# Patient Record
Sex: Female | Born: 1990 | Race: White | Hispanic: No | Marital: Single | Smoking: Never smoker
Health system: Southern US, Community
[De-identification: ages and names within clinical notes are randomized; demographics above are authoritative.]

## PROBLEM LIST (undated history)

## (undated) DIAGNOSIS — Z7689 Persons encountering health services in other specified circumstances: Secondary | ICD-10-CM

## (undated) DIAGNOSIS — E282 Polycystic ovarian syndrome: Secondary | ICD-10-CM

## (undated) DIAGNOSIS — R51 Headache: Secondary | ICD-10-CM

## (undated) DIAGNOSIS — F909 Attention-deficit hyperactivity disorder, unspecified type: Secondary | ICD-10-CM

## (undated) DIAGNOSIS — E88819 Insulin resistance, unspecified: Secondary | ICD-10-CM

## (undated) DIAGNOSIS — I10 Essential (primary) hypertension: Secondary | ICD-10-CM

## (undated) DIAGNOSIS — E669 Obesity, unspecified: Secondary | ICD-10-CM

## (undated) DIAGNOSIS — E8881 Metabolic syndrome: Secondary | ICD-10-CM

## (undated) DIAGNOSIS — K219 Gastro-esophageal reflux disease without esophagitis: Secondary | ICD-10-CM

## (undated) HISTORY — DX: Metabolic syndrome: E88.81

## (undated) HISTORY — DX: Essential (primary) hypertension: I10

## (undated) HISTORY — DX: Headache: R51

## (undated) HISTORY — DX: Persons encountering health services in other specified circumstances: Z76.89

## (undated) HISTORY — DX: Obesity, unspecified: E66.9

## (undated) HISTORY — DX: Attention-deficit hyperactivity disorder, unspecified type: F90.9

## (undated) HISTORY — PX: OTHER SURGICAL HISTORY: SHX169

## (undated) HISTORY — DX: Insulin resistance, unspecified: E88.819

## (undated) HISTORY — DX: Polycystic ovarian syndrome: E28.2

## (undated) HISTORY — DX: Gastro-esophageal reflux disease without esophagitis: K21.9

---
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
## 2012-06-05 ENCOUNTER — Other Ambulatory Visit: Payer: Self-pay | Admitting: Gastroenterology

## 2012-06-07 ENCOUNTER — Other Ambulatory Visit: Payer: Self-pay | Admitting: Family Medicine

## 2012-06-07 DIAGNOSIS — K219 Gastro-esophageal reflux disease without esophagitis: Secondary | ICD-10-CM

## 2012-06-07 MED ORDER — OMEPRAZOLE 40 MG PO CPDR: 40.0000 mg | DELAYED_RELEASE_CAPSULE | Freq: Two times a day (BID) | ORAL | Status: DC

## 2012-06-07 NOTE — Telephone Encounter (Signed)
Needs refill Omeprazole 40 mg BID.  Dr Russella Dar will not refill w/o OV.  Need to renew Medicaid referral.  Temp refill of medication sent to pharmacy.  Will initiate new referral to GI.  Reminded mother patient also due for OV with [place].

## 2012-06-07 NOTE — Telephone Encounter (Signed)
Mother called.  Daughter sees Dr Russella Dar for GERD.  Needs refill

## 2012-06-08 ENCOUNTER — Encounter: Payer: Self-pay | Admitting: *Deleted

## 2012-06-18 ENCOUNTER — Encounter: Payer: Self-pay | Admitting: Gastroenterology

## 2012-06-18 ENCOUNTER — Ambulatory Visit (INDEPENDENT_AMBULATORY_CARE_PROVIDER_SITE_OTHER): Payer: Medicaid Other | Admitting: Gastroenterology

## 2012-06-18 VITALS — BP 110/80 | HR 68 | Ht 61.0 in | Wt 259.0 lb

## 2012-06-18 DIAGNOSIS — K219 Gastro-esophageal reflux disease without esophagitis: Secondary | ICD-10-CM

## 2012-06-18 MED ORDER — OMEPRAZOLE 40 MG PO CPDR: 40.0000 mg | DELAYED_RELEASE_CAPSULE | Freq: Two times a day (BID) | ORAL | Status: DC

## 2012-06-18 NOTE — Progress Notes (Signed)
History of Present Illness: This is a 22 year old female accompanied by her mother. She has chronic stable GERD. Symptoms have been stable for several years on a regimen of omeprazole twice daily. Occasional breakthrough symptoms with high fat foods.   Current Medications, Allergies, Past Medical History, Past Surgical History, Family History and Social History were reviewed in [employer] record.  Physical Exam: General: Well developed , well nourished, obese, no acute distress Head: Normocephalic and atraumatic Eyes:  sclerae anicteric, EOMI Ears: Normal auditory acuity Mouth: No deformity or lesions Lungs: Clear throughout to auscultation Heart: Regular rate and rhythm; no murmurs, rubs or bruits Abdomen: Soft, non tender and non distended. No masses, hepatosplenomegaly or hernias noted. Normal Bowel sounds Musculoskeletal: Symmetrical with no gross deformities  Pulses:  Normal pulses noted Extremities: No clubbing, cyanosis, edema or deformities noted Neurological: Alert oriented x 4, grossly nonfocal Psychological:  Alert and cooperative. Normal mood and affect  Assessment and Recommendations:  1. GERD. Stable for years. Well controlled on current regimen. Continue omeprazole 40 mg a day and standard antireflux measures. I recommended that she return to the care of her primary physician for ongoing followup of this problem and medication refills. I will see back as needed.

## 2012-06-18 NOTE — Patient Instructions (Addendum)
We have sent the following medications to your pharmacy for you to pick up at your convenience: omeprazole.   Please follow up with Allayne Butcher, NP for further routine medication refills and follow up with Dr. Russella Dar as needed.  Thank you for choosing me and [facility].  Venita Lick. Pleas Koch., MD., Clementeen Graham  cc: Allayne Butcher, NP

## 2014-04-21 ENCOUNTER — Other Ambulatory Visit: Payer: Self-pay | Admitting: Gastroenterology

## 2014-04-24 ENCOUNTER — Encounter: Payer: Self-pay | Admitting: Physician Assistant

## 2014-04-24 ENCOUNTER — Ambulatory Visit (INDEPENDENT_AMBULATORY_CARE_PROVIDER_SITE_OTHER): Payer: Medicaid Other | Admitting: Physician Assistant

## 2014-04-24 VITALS — BP 128/70 | HR 80 | Temp 97.5°F | Resp 18 | Wt 265.0 lb

## 2014-04-24 DIAGNOSIS — K219 Gastro-esophageal reflux disease without esophagitis: Secondary | ICD-10-CM

## 2014-04-24 MED ORDER — OMEPRAZOLE 40 MG PO CPDR: 40.0000 mg | DELAYED_RELEASE_CAPSULE | Freq: Two times a day (BID) | ORAL | Status: DC

## 2014-04-24 NOTE — Progress Notes (Signed)
    Patient ID: Heidi Clark MRN: [id number], DOB: 08/17/1990, 24 y.o. Date of Encounter: 04/24/2014, 11:31 AM    Chief Complaint:  Chief Complaint  Patient presents with  . check up for med refills    omeprazole for GERD     HPI: 24 y.o. year old female here with her mom. A say that they are here for follow-up so they can get refills on her omeprazole. She has been evaluated by Dr. Chestine Sporelark at pediatric GI in the past. They say that they then also went to [facility] for follow-up after that. However [facility] had told them that things were stable and they could come here to get her medication prescribed. She has to take the omeprazole twice a day every day or all she has reflux symptoms. However with taking the medication twice daily the symptoms are completely controlled. He has no complaints or concerns today.     Home Meds:   Outpatient Prescriptions Prior to Visit  Medication Sig Dispense Refill  . Norethin-Eth Estradiol-Fe [facility](FEMCON FE,WYMZYA Cecille AmsterdamFE,ZENCHENT FE,ZEOSA) 0.4-35 MG-MCG tablet Chew 1 tablet by mouth daily. 1 Package 11  . omeprazole (PRILOSEC) 40 MG capsule Take 1 capsule (40 mg total) by mouth 2 (two) times daily. 60 capsule 11  . methylphenidate (DAYTRANA) 30 MG/9HR Place 1 patch onto the skin daily. wear patch for 9 hours only each day      No facility-administered medications prior to visit.    Allergies: No Known Allergies    Review of Systems: See HPI for pertinent ROS. All other ROS negative.    Physical Exam: Blood pressure 128/70, pulse 80, temperature 97.5 F (36.4 C), temperature source Oral, resp. rate 18, weight 265 lb (120.203 kg)., Body mass index is 50.1 kg/(m^2). General:  Severely obese white female . Appears in no acute distress. Lungs: Clear bilaterally to auscultation without wheezes, rales, or rhonchi. Breathing is unlabored. Heart: Regular rhythm. No murmurs, rubs, or gallops. Abdomen: Soft, non-tender, non-distended with normoactive bowel  sounds. No hepatomegaly. No rebound/guarding. No obvious abdominal masses. There is no tenderness with palpation not even in the epigastric region. Msk:  Strength and tone normal for age. Extremities/Skin: Warm and dry. Neuro: Alert and oriented X 3. Moves all extremities spontaneously. Gait is normal. CNII-XII grossly in tact. Psych:  Responds to questions appropriately with a normal affect.     ASSESSMENT AND PLAN:  24 y.o. year old female with  1. Gastroesophageal reflux disease, esophagitis presence not specified Stable and controlled on current medication. - omeprazole (PRILOSEC) 40 MG capsule; Take 1 capsule (40 mg total) by mouth 2 (two) times daily.  Dispense: 60 capsule; Refill: [address]11   Signed, Mary Beth Jun[place] Terr[place], [place], [facility] 04/24/2014 11:31 AM

## 2014-08-15 ENCOUNTER — Telehealth: Payer: Self-pay | Admitting: Physician Assistant

## 2014-08-15 NOTE — Telephone Encounter (Signed)
error 

## 2014-08-21 ENCOUNTER — Ambulatory Visit (INDEPENDENT_AMBULATORY_CARE_PROVIDER_SITE_OTHER): Payer: Medicaid Other | Admitting: Physician Assistant

## 2014-08-21 ENCOUNTER — Encounter: Payer: Self-pay | Admitting: Physician Assistant

## 2014-08-21 VITALS — BP 128/80 | HR 82 | Temp 98.3°F | Resp 19 | Wt 260.0 lb

## 2014-08-21 DIAGNOSIS — E8881 Metabolic syndrome: Secondary | ICD-10-CM | POA: Diagnosis not present

## 2014-08-21 DIAGNOSIS — E669 Obesity, unspecified: Secondary | ICD-10-CM | POA: Diagnosis not present

## 2014-08-21 DIAGNOSIS — Z124 Encounter for screening for malignant neoplasm of cervix: Secondary | ICD-10-CM

## 2014-08-21 DIAGNOSIS — E282 Polycystic ovarian syndrome: Secondary | ICD-10-CM | POA: Diagnosis not present

## 2014-08-21 DIAGNOSIS — Z01419 Encounter for gynecological examination (general) (routine) without abnormal findings: Secondary | ICD-10-CM

## 2014-08-21 DIAGNOSIS — Z1239 Encounter for other screening for malignant neoplasm of breast: Secondary | ICD-10-CM

## 2014-08-21 MED ORDER — JUNEL FE 1.5/30 1.5-30 MG-MCG PO TABS: 1.0000 | ORAL_TABLET | Freq: Every day | ORAL | Status: DC

## 2014-08-21 NOTE — Progress Notes (Signed)
Patient ID: Heidi Clark MRN: [id number], DOB: 26-Jul-1990, 24 y.o. Date of Encounter: @DATE @  Chief Complaint:  Chief Complaint  Patient presents with  . Pap smear    HPI: 24 y.o. year old white female  presents with her mom for visit today.  Mom reports that Heidi Clark has been seeing [address] [facility] for years. However, says that when she called to schedule an appointment recently, they told her that they were no longer accepting her insurance. Mom says that Heidi Clark is due for her annual exam and has to have refill on her pills or else her menses will be irregular.  In obtaining history from mom, she states that Heidi Clark's Pediatrician had referred her to the OB/GYN because Heidi Clark's periods were irregular. She says that once they started her on these  Pills (Junel FE),  she has been having regular menses.  However upon chart review also see diagnoses of obesity insulin resistance and polycystic ovarian syndrome.  They state that her last menstrual period was 08/06/2014.   report that as long as she takes these pills that she has monthly menstrual bleeding and regular cycles. They have no complaints or concerns today otherwise.   Past Medical History  Diagnosis Date  . GERD (gastroesophageal reflux disease)   . ADHD (attention deficit hyperactivity disorder)   . Insulin resistance   . Legg-Calve-Perthes disease   . ADHD (attention deficit hyperactivity disorder)   . Polycystic ovarian syndrome   . Obesity   . Headache(784.0)      Home Meds: Outpatient Prescriptions Prior to Visit  Medication Sig Dispense Refill  . omeprazole (PRILOSEC) 40 MG capsule Take 1 capsule (40 mg total) by mouth 2 (two) times daily. 60 capsule 11  . JUNEL FE 1.5/30 1.5-30 MG-MCG tablet Take 1 tablet by mouth daily.  11  . methylphenidate (DAYTRANA) 30 MG/9HR Place 1 patch onto the skin daily. wear patch for 9 hours only each day      No facility-administered medications prior to visit.     Allergies: No Known Allergies  History   Social History  . Marital Status: Married    Spouse Name: N/A  . Number of Children: N/A  . Years of Education: N/A   Occupational History  . Not on file.   Social History Main Topics  . Smoking status: Never Smoker   . Smokeless tobacco: Never Used  . Alcohol Use: No  . Drug Use: No  . Sexual Activity: No     Comment: CELIBATE   Other Topics Concern  . Not on file   Social History Narrative    Family History  Problem Relation Age of Onset  . Diabetes Mother   . Obesity Mother   . Colon cancer Neg Hx      Review of Systems:  See HPI for pertinent ROS. All other ROS negative.    Physical Exam: Blood pressure 128/80, pulse 82, temperature 98.3 F (36.8 C), temperature source Oral, resp. rate 19, weight 260 lb (117.935 kg), last menstrual period 08/13/2014., Body mass index is 49.15 kg/(m^2). General: Severely obese white female Appears in no acute distress. Neck: Supple. No thyromegaly. No lymphadenopathy. Lungs: Clear bilaterally to auscultation without wheezes, rales, or rhonchi. Breathing is unlabored. Heart: RRR with S1 S2. No murmurs, rubs, or gallops. Breast Exam: She has very small breast. Skin is normal with no skin changes. There are no masses and no nipple discharge. Pelvic Exam: External genitalia normal. Vaginal mucosa appears normal. She starts  clinching her muscles tightly and crying out and holding to her mother's hand even when I just touched her thighs much less when I tried to insert the speculum. Could not get a good visualization of the cervix. Bimanual exam is normal but is limited because of her obesity. Extremities/Skin: Warm and dry.  Neuro: Alert and oriented X 3. Moves all extremities spontaneously. Gait is normal. CNII-XII grossly in tact. Psych:  Responds to questions appropriately with a normal affect.  Clinches her muscles and cries out and holds her mom's hand during pelvic exam       ASSESSMENT AND PLAN:  24 y.o. year old female with  1. Encounter for cervical Pap smear with pelvic exam - PAP, ThinPrep ASCUS Rflx HPV Rflx Type  2. Encounter for screening breast examination  3. Obesity - Ambulatory referral to Gynecology  4. Insulin resistance - Ambulatory referral to Gynecology  5. Polycystic ovarian syndrome - Ambulatory referral to Gynecology  Refill sent in on her current pills which is the Junel FE.  Pap sent.  Will schedule follow-up with gynecology.  Signed, [address] [place], [place], [facility] 08/21/2014 11:56 AM

## 2014-08-22 LAB — PAP THINPREP ASCUS RFLX HPV RFLX TYPE

## 2015-01-05 ENCOUNTER — Encounter: Payer: Self-pay | Admitting: Physician Assistant

## 2015-01-05 ENCOUNTER — Ambulatory Visit (INDEPENDENT_AMBULATORY_CARE_PROVIDER_SITE_OTHER): Payer: Medicaid Other | Admitting: Physician Assistant

## 2015-01-05 VITALS — BP 124/70 | HR 112 | Temp 98.7°F | Resp 18 | Wt 264.0 lb

## 2015-01-05 DIAGNOSIS — J988 Other specified respiratory disorders: Secondary | ICD-10-CM

## 2015-01-05 DIAGNOSIS — B9689 Other specified bacterial agents as the cause of diseases classified elsewhere: Secondary | ICD-10-CM

## 2015-01-05 MED ORDER — HYDROCODONE-HOMATROPINE 5-1.5 MG/5ML PO SYRP: 5.0000 mL | ORAL_SOLUTION | Freq: Three times a day (TID) | ORAL | Status: DC | PRN

## 2015-01-05 MED ORDER — AZITHROMYCIN 250 MG PO TABS: ORAL_TABLET | ORAL | Status: DC

## 2015-01-05 NOTE — Progress Notes (Signed)
    Patient ID: Heidi BarrierHannah D Clark MRN: [id number], DOB: 04/22/1990, 24 y.o. Date of Encounter: 01/05/2015, 2:55 PM    Chief Complaint:  Chief Complaint  Patient presents with  . croopy cough x 4 days    worse at night     HPI: 24 y.o. year old white female here with her mom. They report that she has been having the above symptoms. Cough sounds very congested. Is getting very little sleep secondary to cough at night. No nasal congestion or mucus from the nose, no sore throat, no ear ache, no fevers or chills.      Home Meds:   Outpatient Prescriptions Prior to Visit  Medication Sig Dispense Refill  . JUNEL FE 1.5/30 1.5-30 MG-MCG tablet Take 1 tablet by mouth daily. 1 Package 11  . omeprazole (PRILOSEC) 40 MG capsule Take 1 capsule (40 mg total) by mouth 2 (two) times daily. 60 capsule 11   No facility-administered medications prior to visit.    Allergies: No Known Allergies    Review of Systems: See HPI for pertinent ROS. All other ROS negative.    Physical Exam: Blood pressure 124/70, pulse 112, temperature 98.7 F (37.1 C), temperature source Oral, resp. rate 18, weight 264 lb (119.75 kg)., Body mass index is 49.91 kg/(m^2). General:  Severely obese white female . Appears in no acute distress. HEENT: Normocephalic, atraumatic, eyes without discharge, sclera non-icteric, nares are without discharge. Bilateral auditory canals clear, TM's are without perforation, pearly grey and translucent with reflective cone of light bilaterally. Oral cavity moist, posterior pharynx without exudate, erythema, peritonsillar abscess.  Neck: Supple. No thyromegaly. No lymphadenopathy. Lungs: Clear bilaterally to auscultation without wheezes, rales, or rhonchi. Breathing is unlabored. Heart: Regular rhythm. No murmurs, rubs, or gallops. Msk:  Strength and tone normal for age. Extremities/Skin: Warm and dry. Neuro: Alert and oriented X 3. Moves all extremities spontaneously. Gait is normal.  CNII-XII grossly in tact. Psych:  Responds to questions appropriately with a normal affect.     ASSESSMENT AND PLAN:  24 y.o. year old female with  1. Bacterial respiratory infection Mom requests additional medication in addition to antibiotic that can be used so Heidi Clark  can get some sleep. She is to take the antibiotic as directed. Can use the cough medication at bedtime to suppress cough at that point.  Followup if symptoms worsen significantly or if they do not resolve within 1 week after completion of antibiotic. - azithromycin (ZITHROMAX) 250 MG tablet; Day 1: Take 2 daily.  Days 2-5: Take 1 daily.  Dispense: 6 tablet; Refill: 0 - HYDROcodone-homatropine (HYCODAN) 5-1.5 MG/5ML syrup; Take 5 mLs by mouth every 8 (eight) hours as needed for cough.  Dispense: 120 mL; Refill: 0   Signed, [address] StreetMary Beth Juniata GapDixon, [place], [facility] 01/05/2015 2:55 PM

## 2015-01-06 ENCOUNTER — Telehealth: Payer: Self-pay | Admitting: Physician Assistant

## 2015-01-06 NOTE — Telephone Encounter (Signed)
Patients mom calling to say that the cough syrup that was prescribed yesterday is not covered by medicaid, would like to know if something else can be called in  [facility] [address]  [phone]

## 2015-01-06 NOTE — Telephone Encounter (Signed)
Mother called.  There are no covered cough medications

## 2015-09-10 ENCOUNTER — Other Ambulatory Visit: Payer: Self-pay | Admitting: Physician Assistant

## 2015-09-10 NOTE — Telephone Encounter (Signed)
Refill appropriate and filled per protocol. 

## 2015-09-17 ENCOUNTER — Ambulatory Visit (INDEPENDENT_AMBULATORY_CARE_PROVIDER_SITE_OTHER): Payer: Medicaid Other | Admitting: Physician Assistant

## 2015-09-17 VITALS — BP 100/84 | Temp 97.9°F | Ht 60.5 in | Wt 286.0 lb

## 2015-09-17 DIAGNOSIS — N939 Abnormal uterine and vaginal bleeding, unspecified: Secondary | ICD-10-CM | POA: Diagnosis not present

## 2015-09-17 DIAGNOSIS — E8881 Metabolic syndrome: Secondary | ICD-10-CM | POA: Diagnosis not present

## 2015-09-17 DIAGNOSIS — E669 Obesity, unspecified: Secondary | ICD-10-CM

## 2015-09-17 DIAGNOSIS — E282 Polycystic ovarian syndrome: Secondary | ICD-10-CM | POA: Diagnosis not present

## 2015-09-17 MED ORDER — NORETHIN ACE-ETH ESTRAD-FE 1.5-30 MG-MCG PO TABS: 1.0000 | ORAL_TABLET | Freq: Every day | ORAL | Status: DC

## 2015-09-17 NOTE — Progress Notes (Signed)
Patient ID: Heidi Clark MRN: [id number], DOB: 08/03/1990, 25 y.o. Date of Encounter: @DATE @  Chief Complaint:  Chief Complaint  Patient presents with  . Annual Exam    HPI: 25 y.o. year old white female  presents with her mom for visit today.  08/21/2014: Mom reports that Heidi Clark has [facility] for [facility]. However, says that when she called to schedule an appointment recently, they told her that they were no longer accepting her insurance. Mom says that Heidi Clark is due for her annual exam and has to have refill on her pills or else her menses will be irregular.  In obtaining history from mom, she states that Heidi Clark's Pediatrician had referred her to the OB/GYN because Heidi Clark's periods were irregular. She says that once they started her on these  Pills (Junel FE),  she has been having regular menses.  However upon chart review also see diagnoses of obesity insulin resistance and polycystic ovarian syndrome.  They state that her last menstrual period was 08/06/2014.   report that as long as she takes these pills that she has monthly menstrual bleeding and regular cycles. They have no complaints or concerns today otherwise.   09/17/2015: Mom is here with her for visit again today. I reviewed with them that at her last visit 08/21/14 I placed order for referral to a new gynecologist. Mom states that they never heard anything about that appointment/that referral. Today I have ordered that referral again and have told her that if she does not hear anything about that referral/appointment within the next 2-3 weeks then to call here and asked to speak to [facility].  Heidi Clark has continued take the birth control pill daily. She has continued to have regular menses.  No complaints or concerns today.   Past Medical History  Diagnosis Date  . GERD (gastroesophageal reflux disease)   . ADHD (attention deficit hyperactivity disorder)   . Insulin resistance   .  Legg-Calve-Perthes disease   . ADHD (attention deficit hyperactivity disorder)   . Polycystic ovarian syndrome   . Obesity   . Headache(784.0)      Home Meds: Outpatient Prescriptions Prior to Visit  Medication Sig Dispense Refill  . omeprazole (PRILOSEC) 40 MG capsule Take 1 capsule (40 mg total) by mouth 2 (two) times daily. 60 capsule 11  . JUNEL FE 1.5/30 1.5-30 MG-MCG tablet TAKE 1 TABLET BY MOUTH DAILY. 28 tablet 10  . azithromycin (ZITHROMAX) 250 MG tablet Day 1: Take 2 daily.  Days 2-5: Take 1 daily. 6 tablet 0  . HYDROcodone-homatropine (HYCODAN) 5-1.5 MG/5ML syrup Take 5 mLs by mouth every 8 (eight) hours as needed for cough. 120 mL 0   No facility-administered medications prior to visit.    Allergies: No Known Allergies  Social History   Social History  . Marital Status: Married    Spouse Name: N/A  . Number of Children: N/A  . Years of Education: N/A   Occupational History  . Not on file.   Social History Main Topics  . Smoking status: Never Smoker   . Smokeless tobacco: Never Used  . Alcohol Use: No  . Drug Use: No  . Sexual Activity: No     Comment: CELIBATE   Other Topics Concern  . Not on file   Social History Narrative    Family History  Problem Relation Age of Onset  . Diabetes Mother   . Obesity Mother   . Colon cancer Neg Hx  Review of Systems:  See HPI for pertinent ROS. All other ROS negative.    Physical Exam: Blood pressure 100/84, temperature 97.9 F (36.6 C), temperature source Oral, height 5' 0.5" (1.537 m), weight 286 lb (129.729 kg)., Body mass index is 54.91 kg/(m^2). General: Severely obese white female Appears in no acute distress. Neck: Supple. No thyromegaly. No lymphadenopathy. Lungs: Clear bilaterally to auscultation without wheezes, rales, or rhonchi. Breathing is unlabored. Heart: RRR with S1 S2. No murmurs, rubs, or gallops. Breast Exam: She has very small breast. Skin is normal with no skin changes. There are no  masses and no nipple discharge. Pelvic Exam: She has very sparse amount of pubic hair. External genitalia normal. Vaginal mucosa appears normal. She starts clinching her muscles tightly and crying out even when I just touched her thighs much less when I tried to insert the speculum. Could not get a good visualization of the cervix. Bimanual exam is normal but is limited because of her obesity. Extremities/Skin: Warm and dry.  Neuro: Alert and oriented X 3. Moves all extremities spontaneously. Gait is normal. CNII-XII grossly in tact. Psych:  Responds to questions appropriately with a normal affect.        ASSESSMENT AND PLAN:  25 y.o. year old female with   1. Abnormal uterine bleeding - norethindrone-ethinyl estradiol-iron (JUNEL FE 1.5/30) 1.5-30 MG-MCG tablet; Take 1 tablet by mouth daily.  Dispense: 28 tablet; Refill: 10 - Ambulatory referral to Gynecology  2. Obesity - Ambulatory referral to Gynecology  3. Insulin resistance - Ambulatory referral to Gynecology  4. Polycystic ovarian syndrome - Ambulatory referral to Gynecology  Pap smear was performed at her visit here 08/21/14. This was normal. Cytotology negative.  Refill sent in on her current pills which is the Junel FE.   Will schedule follow-up with gynecology. Told her mother that if she has not heard anything about this referral within the next 2-3 weeks and to call here and asked to speak with Selena BattenKim. Explained to them that she needs follow-up with the specialist regarding her PCOS and insulin resistance. They voice understanding and agree.  Signed, [address]Mary Beth Silver SpringsDixon, [place], [facility] 09/17/2015 10:53 AM

## 2015-09-23 ENCOUNTER — Encounter: Payer: Self-pay | Admitting: *Deleted

## 2015-09-25 ENCOUNTER — Encounter: Payer: Self-pay | Admitting: Adult Health

## 2015-09-25 ENCOUNTER — Ambulatory Visit (INDEPENDENT_AMBULATORY_CARE_PROVIDER_SITE_OTHER): Payer: Medicaid Other | Admitting: Adult Health

## 2015-09-25 VITALS — BP 132/80 | HR 106 | Ht 61.0 in | Wt 286.0 lb

## 2015-09-25 DIAGNOSIS — Z0000 Encounter for general adult medical examination without abnormal findings: Secondary | ICD-10-CM | POA: Diagnosis not present

## 2015-09-25 DIAGNOSIS — E282 Polycystic ovarian syndrome: Secondary | ICD-10-CM

## 2015-09-25 DIAGNOSIS — Z7689 Persons encountering health services in other specified circumstances: Secondary | ICD-10-CM

## 2015-09-25 DIAGNOSIS — Z01419 Encounter for gynecological examination (general) (routine) without abnormal findings: Secondary | ICD-10-CM | POA: Insufficient documentation

## 2015-09-25 DIAGNOSIS — E669 Obesity, unspecified: Secondary | ICD-10-CM

## 2015-09-25 HISTORY — DX: Persons encountering health services in other specified circumstances: Z76.89

## 2015-09-25 MED ORDER — NORETHIN-ETH ESTRAD-FE BIPHAS 1 MG-10 MCG / 10 MCG PO TABS: 1.0000 | ORAL_TABLET | Freq: Every day | ORAL | Status: DC

## 2015-09-25 NOTE — Patient Instructions (Signed)
Start  Lo loestrin when finished with current pack Follow up in 3 months

## 2015-09-25 NOTE — Progress Notes (Signed)
Patient ID: Heidi Clark, female   DOB: 11/25/1990, 25 y.o.   MRN: [id number] History of Present Illness:  Heidi ClientHannah is a 25 year old white female,single, in with her Mom for well woman gyn exam, she had normal pap in 2016.She is on OCs but says periods last about 4 days and heavy at times and changes pads every 3-4 hours, she says she has PCO. PCP is Frazier RichardsMary Beth Dixon, [place].   Current Medications, Allergies, Past Medical History, Past Surgical History, Family History and Social History were reviewed in [facility] electronic [facility] record.     Review of Systems: Patient denies any headaches, hearing loss, fatigue, blurred vision, shortness of breath, chest pain, abdominal pain, problems with bowel movements, urination, or intercourse(not having sex). No joint pain or mood swings.See HPI for positives.    Physical Exam:BP 132/80 mmHg  Pulse 106  Ht 5\' 1"  (1.549 m)  Wt 286 lb (129.729 kg)  BMI 54.07 kg/m2  LMP 09/07/2015 General:  Well developed, well nourished, no acute distress Skin:  Warm and dry,no increase in hair growth Neck:  Midline trachea, normal thyroid, good ROM, no lymphadenopathy Lungs; Clear to auscultation bilaterally Breast:  No dominant palpable mass, retraction, or nipple discharge Cardiovascular: Regular rate and rhythm Abdomen:  Soft, non tender, no hepatosplenomegaly,obese Pelvic:  External genitalia is normal in appearance, no lesions.  The vagina is normal in appearance. Urethra has no lesions or masses. The cervix is tiny, and poorly seem.  Uterus is felt to be normal size, shape, and contour.  No adnexal masses or tenderness noted.Bladder is non tender, no masses felt.But difficult due to size and she holds legs stiff Extremities/musculoskeletal:  No swelling or varicosities noted, no clubbing or cyanosis Psych:  No mood changes, alert and cooperative,seems happy   Impression: Well woman gyn exam no pap Period management Obesity History of  PCO    Plan: Check CBC,CMP,TSH and lipids,A1c and vitamin D Finish current pack OCs then start lo loestrin Rx lo loestrin disp 1 pack take 1 daily with 11 refills Follow up in 3 months

## 2015-09-26 LAB — COMPREHENSIVE METABOLIC PANEL
A/G RATIO: 1.2 (ref 1.2–2.2)
ALT: 14 IU/L (ref 0–32)
AST: 16 IU/L (ref 0–40)
Albumin: 4.1 g/dL (ref 3.5–5.5)
Alkaline Phosphatase: 92 IU/L (ref 39–117)
BUN/Creatinine Ratio: 16 (ref 9–23)
BUN: 9 mg/dL (ref 6–20)
Bilirubin Total: 0.3 mg/dL (ref 0.0–1.2)
CALCIUM: 9.3 mg/dL (ref 8.7–10.2)
CO2: 20 mmol/L (ref 18–29)
Chloride: 99 mmol/L (ref 96–106)
Creatinine, Ser: 0.55 mg/dL — ABNORMAL LOW (ref 0.57–1.00)
GFR, EST AFRICAN AMERICAN: 153 mL/min/{1.73_m2} (ref >59)
GFR, EST NON AFRICAN AMERICAN: 133 mL/min/{1.73_m2} (ref >59)
GLOBULIN, TOTAL: 3.3 g/dL (ref 1.5–4.5)
Glucose: 98 mg/dL (ref 65–99)
POTASSIUM: 4.1 mmol/L (ref 3.5–5.2)
SODIUM: 138 mmol/L (ref 134–144)
TOTAL PROTEIN: 7.4 g/dL (ref 6.0–8.5)

## 2015-09-26 LAB — CBC
HEMATOCRIT: 43.3 % (ref 34.0–46.6)
Hemoglobin: 14.4 g/dL (ref 11.1–15.9)
MCH: 29 pg (ref 26.6–33.0)
MCHC: 33.3 g/dL (ref 31.5–35.7)
MCV: 87 fL (ref 79–97)
Platelets: 323 10*3/uL (ref 150–379)
RBC: 4.97 x10E6/uL (ref 3.77–5.28)
RDW: 13.7 % (ref 12.3–15.4)
WBC: 12.6 10*3/uL — AB (ref 3.4–10.8)

## 2015-09-26 LAB — LIPID PANEL
CHOL/HDL RATIO: 3.9 ratio (ref 0.0–4.4)
Cholesterol, Total: 156 mg/dL (ref 100–199)
HDL: 40 mg/dL (ref >39)
LDL CALC: 76 mg/dL (ref 0–99)
Triglycerides: 198 mg/dL — ABNORMAL HIGH (ref 0–149)
VLDL Cholesterol Cal: 40 mg/dL (ref 5–40)

## 2015-09-26 LAB — VITAMIN D 25 HYDROXY (VIT D DEFICIENCY, FRACTURES): Vit D, 25-Hydroxy: 23.4 ng/mL — ABNORMAL LOW (ref 30.0–100.0)

## 2015-09-26 LAB — HEMOGLOBIN A1C
Est. average glucose Bld gHb Est-mCnc: 111 mg/dL
Hgb A1c MFr Bld: 5.5 % (ref 4.8–5.6)

## 2015-09-26 LAB — TSH: TSH: 3.14 u[IU]/mL (ref 0.450–4.500)

## 2015-09-28 ENCOUNTER — Telehealth: Payer: Self-pay | Admitting: Adult Health

## 2015-09-28 NOTE — Telephone Encounter (Signed)
No answer

## 2015-09-29 ENCOUNTER — Telehealth: Payer: Self-pay | Admitting: Adult Health

## 2015-09-29 NOTE — Telephone Encounter (Signed)
Pt got your call last pm. I spoke with pt letting her know you were out of the office this week. May be Monday before she hears back from office or could be before. Pt is ok with that. Thanks!! JSY

## 2015-10-03 MED ORDER — CHOLECALCIFEROL 125 MCG (5000 UT) PO CAPS: 5000.0000 [IU] | ORAL_CAPSULE | Freq: Every day | ORAL | Status: AC

## 2015-10-03 NOTE — Telephone Encounter (Signed)
Aware of labs and need to take vitamin D3 5000 IU every day at least 2000 IU, if can't do 5000 IU and decrease carbs and fats to aid in weight loss

## 2015-12-23 ENCOUNTER — Ambulatory Visit (INDEPENDENT_AMBULATORY_CARE_PROVIDER_SITE_OTHER): Payer: Medicaid Other | Admitting: Adult Health

## 2015-12-23 ENCOUNTER — Encounter: Payer: Self-pay | Admitting: Adult Health

## 2015-12-23 VITALS — BP 100/80 | HR 82 | Ht 61.0 in | Wt 289.0 lb

## 2015-12-23 DIAGNOSIS — E282 Polycystic ovarian syndrome: Secondary | ICD-10-CM

## 2015-12-23 DIAGNOSIS — Z6841 Body Mass Index (BMI) 40.0 and over, adult: Secondary | ICD-10-CM | POA: Diagnosis not present

## 2015-12-23 DIAGNOSIS — Z308 Encounter for other contraceptive management: Secondary | ICD-10-CM | POA: Diagnosis not present

## 2015-12-23 DIAGNOSIS — E6609 Other obesity due to excess calories: Secondary | ICD-10-CM

## 2015-12-23 DIAGNOSIS — IMO0001 Reserved for inherently not codable concepts without codable children: Secondary | ICD-10-CM

## 2015-12-23 DIAGNOSIS — Z7689 Persons encountering health services in other specified circumstances: Secondary | ICD-10-CM

## 2015-12-23 NOTE — Progress Notes (Signed)
Subjective:     Patient ID: Docia BarrierHannah D Fosdick, female   DOB: 03/24/1990, 25 y.o.   MRN: [id number]  HPI [name] is a 25 year old white female in for follow up of starting lo loestrin and she says she is doing good, no complaints so far.  Review of Systems Patient denies any headaches, hearing loss, fatigue, blurred vision, shortness of breath, chest pain, abdominal pain, problems with bowel movements, urination, or intercourse(not having sex). No joint pain or mood swings.   Reviewed past medical,surgical, social and family history. Reviewed medications and allergies.  Objective:   Physical Exam BP 100/80 (BP Location: Left Arm, Patient Position: Sitting, Cuff Size: Large)   Pulse 82   Ht 5\' 1"  (1.549 m)   Wt 289 lb (131.1 kg)   LMP 12/16/2015 (Exact Date)   BMI 54.61 kg/m  Skin warm and dry.  Lungs: clear to ausculation bilaterally. Cardiovascular: regular rate and rhythm.   PHQ 2 score 0. Encouraged to work on weight.  Assessment:     1. Encounter for menstrual regulation   2. Polycystic ovarian syndrome   3. Class 3 obesity due to excess calories without serious comorbidity with body mass index (BMI) of 50.0 to 59.9 in adult [facility](HCC)       Plan:     Continue lo loestrin Decrease carbs and increase exercise for weight loss  Follow up  in July for physical

## 2015-12-23 NOTE — Patient Instructions (Addendum)
Continue lo loestrin Decrease carbs and increase exercise for weight loss  Follow up  in July for physical

## 2016-08-08 ENCOUNTER — Other Ambulatory Visit: Payer: Self-pay | Admitting: Adult Health

## 2016-08-09 NOTE — Progress Notes (Signed)
Dr. Randa EvensJensen's office called for orders.

## 2016-08-09 NOTE — Pre-Procedure Instructions (Signed)
Heidi Clark  08/09/2016      [id number] [place], [place] - [address] [place] [address] Phone: [phone] Fax: [phone]    Your procedure is scheduled on 08-12-2016  Friday .  Report to [facility] Admitting at 7:00 A.M.   Call this number if you have problems the morning of surgery:  [phone]   Remember:  Do not eat food or drink liquids after midnight.   Take these medicines the morning of surgery with A SIP OF WATER omeprazole(Prilosec)   Do not wear jewelry, make-up or nail polish.  Do not wear lotions, powders, or perfumes, or deoderant.  Do not shave 48 hours prior to surgery.  Men may shave face and neck.   Do not bring valuables to the hospital.  [facility] is not responsible for any belongings or valuables.  Contacts, dentures or bridgework may not be worn into surgery.  Leave your suitcase in the car.  After surgery it may be brought to your room.  For patients admitted to the hospital, discharge time will be determined by your treatment team.  Patients discharged the day of surgery will not be allowed to drive home.   Special Instructions: [place] - Preparing for Surgery  Before surgery, you can play an important role.  Because skin is not sterile, your skin needs to be as free of germs as possible.  You can reduce the number of germs on you skin by washing with CHG (chlorahexidine gluconate) soap before surgery.  CHG is an antiseptic cleaner which kills germs and bonds with the skin to continue killing germs even after washing.  Please DO NOT use if you have an allergy to CHG or antibacterial soaps.  If your skin becomes reddened/irritated stop using the CHG and inform your nurse when you arrive at Short Stay.  Do not shave (including legs and underarms) for at least 48 hours prior to the first CHG shower.  You may shave your face.  Please follow these  instructions carefully:   1.  Shower with CHG Soap the night before surgery and the   morning of Surgery.  2.  If you choose to wash your hair, wash your hair first as usual with your normal shampoo.  3.  After you shampoo, rinse your hair and body thoroughly to remove the  Shampoo.  4.  Use CHG as you would any other liquid soap.  You can apply chg directly  to the skin and wash gently with scrungie or a clean washcloth.  5.  Apply the CHG Soap to your body ONLY FROM THE NECK DOWN.   Do not use on open wounds or open sores.  Avoid contact with your eyes,  ears, mouth and genitals (private parts).  Wash genitals (private parts) with your normal soap.  6.  Wash thoroughly, paying special attention to the area where your surgery will be performed.  7.  Thoroughly rinse your body with warm water from the neck down.  8.  DO NOT shower/wash with your normal soap after using and rinsing o  the CHG Soap.  9.  Pat yourself dry with a clean towel.            10.  Wear clean pajamas.            11.  Place clean sheets on your bed the night of your first shower and do  not sleep with pets.  Day of Surgery  Do not apply any lotions/deodorants the morning of surgery.  Please wear clean clothes to the hospital/surgery center.

## 2016-08-10 ENCOUNTER — Encounter (HOSPITAL_COMMUNITY)
Admission: RE | Admit: 2016-08-10 | Discharge: 2016-08-10 | Disposition: A | Discharge status: Home / Self Care | Payer: Medicaid Other | Source: Ambulatory Visit | Attending: Oral Surgery | Admitting: Oral Surgery

## 2016-08-10 ENCOUNTER — Encounter (HOSPITAL_COMMUNITY): Payer: Self-pay

## 2016-08-10 DIAGNOSIS — F909 Attention-deficit hyperactivity disorder, unspecified type: Secondary | ICD-10-CM | POA: Insufficient documentation

## 2016-08-10 DIAGNOSIS — E282 Polycystic ovarian syndrome: Secondary | ICD-10-CM | POA: Diagnosis not present

## 2016-08-10 DIAGNOSIS — Z7689 Persons encountering health services in other specified circumstances: Secondary | ICD-10-CM | POA: Insufficient documentation

## 2016-08-10 DIAGNOSIS — E669 Obesity, unspecified: Secondary | ICD-10-CM | POA: Insufficient documentation

## 2016-08-10 DIAGNOSIS — M9110 Juvenile osteochondrosis of head of femur [Legg-Calve-Perthes], unspecified leg: Secondary | ICD-10-CM | POA: Diagnosis not present

## 2016-08-10 DIAGNOSIS — Z01812 Encounter for preprocedural laboratory examination: Secondary | ICD-10-CM | POA: Insufficient documentation

## 2016-08-10 DIAGNOSIS — K219 Gastro-esophageal reflux disease without esophagitis: Secondary | ICD-10-CM | POA: Insufficient documentation

## 2016-08-10 LAB — CBC
HEMATOCRIT: 49 % — AB (ref 36.0–46.0)
HEMOGLOBIN: 16.5 g/dL — AB (ref 12.0–15.0)
MCH: 28.8 pg (ref 26.0–34.0)
MCHC: 33.7 g/dL (ref 30.0–36.0)
MCV: 85.5 fL (ref 78.0–100.0)
Platelets: 280 10*3/uL (ref 150–400)
RBC: 5.73 MIL/uL — ABNORMAL HIGH (ref 3.87–5.11)
RDW: 13.8 % (ref 11.5–15.5)
WBC: 11.9 10*3/uL — ABNORMAL HIGH (ref 4.0–10.5)

## 2016-08-10 LAB — BASIC METABOLIC PANEL
ANION GAP: 14 (ref 5–15)
BUN: 10 mg/dL (ref 6–20)
CO2: 19 mmol/L — AB (ref 22–32)
Calcium: 9.7 mg/dL (ref 8.9–10.3)
Chloride: 107 mmol/L (ref 101–111)
Creatinine, Ser: 0.63 mg/dL (ref 0.44–1.00)
GFR calc Af Amer: >60 mL/min (ref >60)
GFR calc non Af Amer: >60 mL/min (ref >60)
Glucose, Bld: 87 mg/dL (ref 65–99)
POTASSIUM: 4.2 mmol/L (ref 3.5–5.1)
Sodium: 140 mmol/L (ref 135–145)

## 2016-08-10 LAB — HCG, SERUM, QUALITATIVE: Preg, Serum: NEGATIVE

## 2016-08-12 ENCOUNTER — Encounter (HOSPITAL_COMMUNITY): Payer: Self-pay | Admitting: Certified Registered Nurse Anesthetist

## 2016-08-12 ENCOUNTER — Ambulatory Visit (HOSPITAL_COMMUNITY): Payer: Medicaid Other | Admitting: Certified Registered Nurse Anesthetist

## 2016-08-12 ENCOUNTER — Encounter (HOSPITAL_COMMUNITY)
Admission: RE | Disposition: A | Discharge status: Home / Self Care | Payer: Self-pay | Source: Ambulatory Visit | Attending: Oral Surgery

## 2016-08-12 ENCOUNTER — Ambulatory Visit (HOSPITAL_COMMUNITY)
Admission: RE | Admit: 2016-08-12 | Discharge: 2016-08-12 | Disposition: A | Discharge status: Home / Self Care | Payer: Medicaid Other | Source: Ambulatory Visit | Attending: Oral Surgery | Admitting: Oral Surgery

## 2016-08-12 DIAGNOSIS — M1990 Unspecified osteoarthritis, unspecified site: Secondary | ICD-10-CM | POA: Insufficient documentation

## 2016-08-12 DIAGNOSIS — K011 Impacted teeth: Secondary | ICD-10-CM | POA: Diagnosis not present

## 2016-08-12 DIAGNOSIS — K219 Gastro-esophageal reflux disease without esophagitis: Secondary | ICD-10-CM | POA: Insufficient documentation

## 2016-08-12 HISTORY — PX: TOOTH EXTRACTION: SHX859

## 2016-08-12 SURGERY — DENTAL RESTORATION/EXTRACTIONS
Anesthesia: General | Site: Mouth

## 2016-08-12 MED ORDER — FENTANYL CITRATE (PF) 100 MCG/2ML IJ SOLN: 25.0000 ug | INTRAMUSCULAR | Status: DC | PRN

## 2016-08-12 MED ORDER — 0.9 % SODIUM CHLORIDE (POUR BTL) OPTIME
TOPICAL | Status: DC | PRN
  Administered 2016-08-12: 1000 mL

## 2016-08-12 MED ORDER — FENTANYL CITRATE (PF) 250 MCG/5ML IJ SOLN
INTRAMUSCULAR | Status: AC
  Filled 2016-08-12: qty 5

## 2016-08-12 MED ORDER — LACTATED RINGERS IV SOLN
INTRAVENOUS | Status: DC
  Administered 2016-08-12 (×2): via INTRAVENOUS

## 2016-08-12 MED ORDER — CLINDAMYCIN PHOSPHATE 600 MG/50ML IV SOLN
600.0000 mg | INTRAVENOUS | Status: DC
  Filled 2016-08-12: qty 50

## 2016-08-12 MED ORDER — PROPOFOL 10 MG/ML IV BOLUS
INTRAVENOUS | Status: DC | PRN
  Administered 2016-08-12: 200 mg via INTRAVENOUS

## 2016-08-12 MED ORDER — PROPOFOL 10 MG/ML IV BOLUS
INTRAVENOUS | Status: AC
  Filled 2016-08-12: qty 20

## 2016-08-12 MED ORDER — LIDOCAINE-EPINEPHRINE 2 %-1:100000 IJ SOLN
INTRAMUSCULAR | Status: DC | PRN
  Administered 2016-08-12: 10 mL

## 2016-08-12 MED ORDER — ONDANSETRON HCL 4 MG/2ML IJ SOLN
INTRAMUSCULAR | Status: DC | PRN
  Administered 2016-08-12: 4 mg via INTRAVENOUS

## 2016-08-12 MED ORDER — MIDAZOLAM HCL 5 MG/5ML IJ SOLN
INTRAMUSCULAR | Status: DC | PRN
  Administered 2016-08-12: 2 mg via INTRAVENOUS

## 2016-08-12 MED ORDER — DEXAMETHASONE SODIUM PHOSPHATE 4 MG/ML IJ SOLN
INTRAMUSCULAR | Status: DC | PRN
  Administered 2016-08-12: 10 mg via INTRAVENOUS

## 2016-08-12 MED ORDER — MIDAZOLAM HCL 2 MG/2ML IJ SOLN
INTRAMUSCULAR | Status: AC
  Filled 2016-08-12: qty 2

## 2016-08-12 MED ORDER — LIDOCAINE HCL (CARDIAC) 20 MG/ML IV SOLN
INTRAVENOUS | Status: DC | PRN
  Administered 2016-08-12: 100 mg via INTRAVENOUS

## 2016-08-12 MED ORDER — FENTANYL CITRATE (PF) 100 MCG/2ML IJ SOLN
INTRAMUSCULAR | Status: DC | PRN
  Administered 2016-08-12: 100 ug via INTRAVENOUS
  Administered 2016-08-12: 50 ug via INTRAVENOUS

## 2016-08-12 MED ORDER — SODIUM CHLORIDE 0.9 % IR SOLN
Status: DC | PRN
  Administered 2016-08-12: 1000 mL

## 2016-08-12 MED ORDER — SUCCINYLCHOLINE CHLORIDE 20 MG/ML IJ SOLN
INTRAMUSCULAR | Status: DC | PRN
  Administered 2016-08-12: 140 mg via INTRAVENOUS

## 2016-08-12 MED ORDER — PROMETHAZINE HCL 25 MG/ML IJ SOLN: 6.2500 mg | INTRAMUSCULAR | Status: DC | PRN

## 2016-08-12 MED ORDER — HYDROCODONE-ACETAMINOPHEN 5-325 MG PO TABS: 1.0000 | ORAL_TABLET | Freq: Four times a day (QID) | ORAL | 0 refills | Status: DC | PRN

## 2016-08-12 MED ORDER — LIDOCAINE-EPINEPHRINE 2 %-1:100000 IJ SOLN
INTRAMUSCULAR | Status: AC
  Filled 2016-08-12: qty 1

## 2016-08-12 SURGICAL SUPPLY — 22 items
BUR CROSS CUT FISSURE 1.6 (BURR) ×1 IMPLANT
CANISTER SUCT 3000ML PPV (MISCELLANEOUS) ×2 IMPLANT
COVER SURGICAL LIGHT HANDLE (MISCELLANEOUS) ×2 IMPLANT
GAUZE PACKING FOLDED 2  STR (GAUZE/BANDAGES/DRESSINGS) ×1
GAUZE PACKING FOLDED 2 STR (GAUZE/BANDAGES/DRESSINGS) ×1 IMPLANT
GLOVE BIO SURGEON STRL SZ 6.5 (GLOVE) ×2 IMPLANT
GLOVE BIO SURGEON STRL SZ7.5 (GLOVE) ×2 IMPLANT
GLOVE BIOGEL PI IND STRL 7.0 (GLOVE) ×1 IMPLANT
GLOVE BIOGEL PI INDICATOR 7.0 (GLOVE) ×1
GOWN STRL REUS W/ TWL LRG LVL3 (GOWN DISPOSABLE) ×1 IMPLANT
GOWN STRL REUS W/ TWL XL LVL3 (GOWN DISPOSABLE) ×1 IMPLANT
GOWN STRL REUS W/TWL LRG LVL3 (GOWN DISPOSABLE) ×2
GOWN STRL REUS W/TWL XL LVL3 (GOWN DISPOSABLE) ×2
KIT BASIN OR (CUSTOM PROCEDURE TRAY) ×2 IMPLANT
KIT ROOM TURNOVER OR (KITS) ×2 IMPLANT
NEEDLE 22X1 1/2 (OR ONLY) (NEEDLE) ×4 IMPLANT
NS IRRIG 1000ML POUR BTL (IV SOLUTION) ×2 IMPLANT
PAD ARMBOARD 7.5X6 YLW CONV (MISCELLANEOUS) ×2 IMPLANT
SUT CHROMIC 3 0 PS 2 (SUTURE) ×1 IMPLANT
TRAY ENT MC OR (CUSTOM PROCEDURE TRAY) ×2 IMPLANT
TUBING IRRIGATION (MISCELLANEOUS) ×2 IMPLANT
YANKAUER SUCT BULB TIP NO VENT (SUCTIONS) ×2 IMPLANT

## 2016-08-12 NOTE — Anesthesia Procedure Notes (Signed)
Procedure Name: Intubation Date/Time: 08/12/2016 8:43 AM Performed by: Oletta Lamas Pre-anesthesia Checklist: Patient identified, Emergency Drugs available, Suction available and Patient being monitored Patient Re-evaluated:Patient Re-evaluated prior to inductionOxygen Delivery Method: Circle System Utilized Preoxygenation: Pre-oxygenation with 100% oxygen Intubation Type: IV induction Ventilation: Mask ventilation without difficulty Laryngoscope Size: Mac and 3 Tube type: Oral Number of attempts: 1 Airway Equipment and Method: Stylet and Oral airway Placement Confirmation: ETT inserted through vocal cords under direct vision,  positive ETCO2 and breath sounds checked- equal and bilateral Secured at: 22 cm Tube secured with: Tape Dental Injury: Teeth and Oropharynx as per pre-operative assessment

## 2016-08-12 NOTE — H&P (Signed)
HISTORY AND PHYSICAL  Heidi Clark is a 26 y.o. female patient with CC: Painful wisdom teeth.  No diagnosis found.  Past Medical History:  Diagnosis Date  . ADHD (attention deficit hyperactivity disorder)   . ADHD (attention deficit hyperactivity disorder)   . Encounter for menstrual regulation 09/25/2015  . GERD (gastroesophageal reflux disease)   . Headache(784.0)   . Insulin resistance   . Obesity   . Polycystic ovarian syndrome     No current facility-administered medications for this encounter.    Allergies  Allergen Reactions  . No Known Allergies    Active Problems:   * No active hospital problems. *  Vitals: Blood pressure (!) 118/41, pulse 100, temperature 97.9 F (36.6 C), temperature source Oral, resp. rate 20, SpO2 99 %. Lab results:No results found for this or any previous visit (from the past 24 hour(s)). Radiology Results: No results found. General appearance: alert, cooperative and morbidly obese Head: Normocephalic, without obvious abnormality, atraumatic Eyes: negative Nose: Nares normal. Septum midline. Mucosa normal. No drainage or sinus tenderness. Throat: lips, mucosa, and tongue normal; teeth and gums normal and impacted teeth # 17 and 32. Pharynx clear. Neck: no adenopathy, supple, symmetrical, trachea midline and thyroid not enlarged, symmetric, no tenderness/mass/nodules Resp: clear to auscultation bilaterally Cardio: regular rate and rhythm, S1, S2 normal, no murmur, click, rub or gallop  Assessment: Impacted teeth 17, 32. Plan: Dental extractions with GA. Day surgery.   Nichalas Coin M 08/12/2016

## 2016-08-12 NOTE — Op Note (Signed)
08/12/2016  8:58 AM  PATIENT:  Heidi Clark  26 y.o. female  PRE-OPERATIVE DIAGNOSIS:  NON RESTORABLE/IMPACTED TEETH 17, 32  POST-OPERATIVE DIAGNOSIS:  SAME  PROCEDURE:  Procedure(s): EXTRACTION TEETH 17, 32  SURGEON:  Surgeon(s): Ocie DoyneJensen, Mella Inclan, DDS  ANESTHESIA:   local and general  EBL:  minimal  DRAINS: none   SPECIMEN:  No Specimen  COUNTS:  YES  PLAN OF CARE: Discharge to home after PACU  PATIENT DISPOSITION:  PACU - hemodynamically stable.   PROCEDURE DETAILS: Dictation # [id number]  Georgia LopesScott M. Afia Messenger, DMD 08/12/2016 8:58 AM

## 2016-08-12 NOTE — Op Note (Signed)
NAME:  Heidi Clark, Heidi Clark             ACCOUNT NO.:  [id number]  MEDICAL RECORD NO.:  [id number]  LOCATION:                                 FACILITY:  PHYSICIAN:  Georgia LopesScott M. Cia Garretson, M.D.       DATE OF BIRTH:  DATE OF PROCEDURE:  08/12/2016 DATE OF DISCHARGE:                              OPERATIVE REPORT   PREOPERATIVE DIAGNOSIS:  Nonrestorable impacted teeth numbers 17, 32.  POSTOPERATIVE DIAGNOSIS:  Nonrestorable impacted teeth numbers 17, 32.  PROCEDURE:  Extraction of teeth #17 and #32.  SURGEON:  Georgia LopesScott M. Paulanthony Gleaves, M.D.  ANESTHESIA:  General, oral intubation.  DESCRIPTION OF PROCEDURE:  The patient was taken to the operating room, placed on the table in supine position.  General anesthesia was administered and an oral endotracheal tube was placed and secured.  The eyes were protected and the patient was draped for the procedure.  Time- out was performed.  Posterior pharynx was suctioned.  A throat pack was placed.  A 2% lidocaine with 1:100,000 epinephrine was infiltrated in an inferior alveolar block on the right and left sides.  A total of 10 mL of solution was administered.  A bite block was placed in the right side of the mouth and a Sweetheart retractor was used to retract the tongue. A 15 blade was used to make an incision around tooth #17 buccally and lingually in the gingival sulcus.  The periosteum was reflected.  The tooth was elevated with a 301 elevator and removed from the mouth with a dental forceps.  The socket was curetted.  No suture was required and then the bite block and sweetheart retractor were repositioned to the side of the mouth as was the endotracheal tube and it was re-taped on the left cheek.  Then, a 15 blade was used to make an incision around tooth #32.  The periosteum was reflected.  Bone was removed using a Stryker handpiece and then tooth #32 was elevated and removed from the mouth.  The socket was curetted, irrigated, and closed with 3-0  chromic. The oral cavity was then irrigated and suctioned.  Throat pack was removed.  The patient was left in care of Anesthesia for awakening and transportation to recovery room and discharged.  ESTIMATED BLOOD LOSS:  Minimal.  COMPLICATIONS:  None.  SPECIMENS:  None.     Georgia LopesScott M. Liisa Picone, M.D.   ______________________________ Georgia LopesScott M. Kinsley Nicklaus, M.D.    SMJ/MEDQ  D:  08/12/2016  T:  08/12/2016  Job:  [id number]

## 2016-08-12 NOTE — Transfer of Care (Signed)
Immediate Anesthesia Transfer of Care Note  Patient: Heidi BarrierHannah D Harari  Procedure(s) Performed: Procedure(s): DENTAL RESTORATION/EXTRACTIONS (N/A)  Patient Location: PACU  Anesthesia Type:General  Level of Consciousness: awake, oriented, drowsy and patient cooperative  Airway & Oxygen Therapy: Patient Spontanous Breathing and Patient connected to nasal cannula oxygen  Post-op Assessment: Report given to RN and Post -op Vital signs reviewed and stable  Post vital signs: Reviewed and stable  Last Vitals:  Vitals:   08/12/16 0643 08/12/16 0912  BP: (!) 118/41   Pulse: 100   Resp: 20   Temp: 36.6 C (P) 36.4 C    Last Pain:  Vitals:   08/12/16 0643  TempSrc: Oral         Complications: No apparent anesthesia complications

## 2016-08-12 NOTE — Anesthesia Preprocedure Evaluation (Signed)
Anesthesia Evaluation  Patient identified by MRN, date of birth, ID band Patient awake    Reviewed: Allergy & Precautions, NPO status , Patient's Chart, lab work & pertinent test results  Airway Mallampati: II  TM Distance: >3 FB Neck ROM: Full    Dental   Pulmonary neg pulmonary ROS,    breath sounds clear to auscultation       Cardiovascular negative cardio ROS   Rhythm:Regular Rate:Normal     Neuro/Psych PSYCHIATRIC DISORDERS negative neurological ROS     GI/Hepatic Neg liver ROS, GERD  ,  Endo/Other  Morbid obesity  Renal/GU negative Renal ROS     Musculoskeletal  (+) Arthritis ,   Abdominal   Peds  Hematology negative hematology ROS (+)   Anesthesia Other Findings   Reproductive/Obstetrics                             Lab Results  Component Value Date   WBC 11.9 (H) 08/10/2016   HGB 16.5 (H) 08/10/2016   HCT 49.0 (H) 08/10/2016   MCV 85.5 08/10/2016   PLT 280 08/10/2016   Lab Results  Component Value Date   CREATININE 0.63 08/10/2016   BUN 10 08/10/2016   NA 140 08/10/2016   K 4.2 08/10/2016   CL 107 08/10/2016   CO2 19 (L) 08/10/2016    Anesthesia Physical Anesthesia Plan  ASA: III  Anesthesia Plan: General   Post-op Pain Management:    Induction: Intravenous  Airway Management Planned: Nasal ETT and Oral ETT  Additional Equipment:   Intra-op Plan:   Post-operative Plan: Extubation in OR  Informed Consent: I have reviewed the patients History and Physical, chart, labs and discussed the procedure including the risks, benefits and alternatives for the proposed anesthesia with the patient or authorized representative who has indicated his/her understanding and acceptance.   Dental advisory given  Plan Discussed with: CRNA  Anesthesia Plan Comments:         Anesthesia Quick Evaluation

## 2016-08-13 NOTE — Anesthesia Postprocedure Evaluation (Signed)
Anesthesia Post Note  Patient: Docia BarrierHannah D Modesitt  Procedure(s) Performed: Procedure(s) (LRB): DENTAL RESTORATION/EXTRACTIONS (N/A)     Patient location during evaluation: PACU Anesthesia Type: General Level of consciousness: awake and alert Pain management: pain level controlled Vital Signs Assessment: post-procedure vital signs reviewed and stable Respiratory status: spontaneous breathing, nonlabored ventilation, respiratory function stable and patient connected to nasal cannula oxygen Cardiovascular status: blood pressure returned to baseline and stable Postop Assessment: no signs of nausea or vomiting Anesthetic complications: no    Last Vitals:  Vitals:   08/12/16 1004 08/12/16 1015  BP: 106/65 108/65  Pulse:  (!) 108  Resp:    Temp:      Last Pain:  Vitals:   08/12/16 0643  TempSrc: Napoleon Formral                 Admiral Marcucci E

## 2016-08-14 ENCOUNTER — Encounter (HOSPITAL_COMMUNITY): Payer: Self-pay | Admitting: Oral Surgery

## 2016-09-09 ENCOUNTER — Encounter: Payer: Self-pay | Admitting: Adult Health

## 2016-09-09 ENCOUNTER — Encounter: Payer: Medicaid Other | Admitting: Adult Health

## 2016-09-12 NOTE — Progress Notes (Signed)
This encounter was created in error - please disregard.

## 2016-09-27 ENCOUNTER — Ambulatory Visit: Payer: Medicaid Other | Admitting: Adult Health

## 2016-09-29 ENCOUNTER — Other Ambulatory Visit: Payer: Self-pay | Admitting: Women's Health

## 2016-09-29 ENCOUNTER — Other Ambulatory Visit: Payer: Self-pay | Admitting: Adult Health

## 2016-10-12 ENCOUNTER — Ambulatory Visit: Payer: Medicaid Other | Admitting: Adult Health

## 2016-10-12 ENCOUNTER — Other Ambulatory Visit (HOSPITAL_COMMUNITY)
Admission: RE | Admit: 2016-10-12 | Discharge: 2016-10-12 | Disposition: A | Discharge status: Home / Self Care | Payer: Medicaid Other | Source: Ambulatory Visit | Attending: Adult Health | Admitting: Adult Health

## 2016-10-12 ENCOUNTER — Encounter: Payer: Self-pay | Admitting: Adult Health

## 2016-10-12 VITALS — BP 120/78 | HR 66 | Ht 61.0 in | Wt 283.0 lb

## 2016-10-12 DIAGNOSIS — E6609 Other obesity due to excess calories: Secondary | ICD-10-CM | POA: Diagnosis not present

## 2016-10-12 DIAGNOSIS — Z01411 Encounter for gynecological examination (general) (routine) with abnormal findings: Secondary | ICD-10-CM

## 2016-10-12 DIAGNOSIS — E282 Polycystic ovarian syndrome: Secondary | ICD-10-CM | POA: Diagnosis not present

## 2016-10-12 DIAGNOSIS — Z0001 Encounter for general adult medical examination with abnormal findings: Secondary | ICD-10-CM

## 2016-10-12 DIAGNOSIS — IMO0001 Reserved for inherently not codable concepts without codable children: Secondary | ICD-10-CM

## 2016-10-12 DIAGNOSIS — Z6841 Body Mass Index (BMI) 40.0 and over, adult: Secondary | ICD-10-CM

## 2016-10-12 DIAGNOSIS — Z01419 Encounter for gynecological examination (general) (routine) without abnormal findings: Secondary | ICD-10-CM

## 2016-10-12 DIAGNOSIS — Z7689 Persons encountering health services in other specified circumstances: Secondary | ICD-10-CM

## 2016-10-12 DIAGNOSIS — Z124 Encounter for screening for malignant neoplasm of cervix: Secondary | ICD-10-CM

## 2016-10-12 MED ORDER — NORETHIN-ETH ESTRAD-FE BIPHAS 1 MG-10 MCG / 10 MCG PO TABS: 1.0000 | ORAL_TABLET | Freq: Every day | ORAL | 12 refills | Status: DC

## 2016-10-12 NOTE — Progress Notes (Signed)
Patient ID: Heidi Clark D Clark, female   DOB: 10/12/1990, 26 y.o.   MRN: [id number] History of Present Illness: [name] is a 26 year old white female in for well woman gyn exam and pap and refills lo loestrin.No complaints today.Periods are good. PCP is Frazier Richards[facility] Beth Dixon [facility].    Current Medications, Allergies, Past Medical History, Past Surgical History, Family History and Social History were reviewed in [facility] electronic medical record.     Review of Systems: Patient denies any headaches, hearing loss, fatigue, blurred vision, shortness of breath, chest pain, abdominal pain, problems with bowel movements, urination, or intercourse(not having sex). No joint pain or mood swings. Periods are good.    Physical Exam:BP 120/78 (BP Location: Right Arm, Patient Position: Sitting, Cuff Size: Large)   Pulse 66   Ht 5\' 1"  (1.549 m)   Wt 283 lb (128.4 kg)   LMP 10/02/2016   BMI 53.47 kg/m  General:  Well developed, well nourished, no acute distress Skin:  Warm and dry Neck:  Midline trachea, normal thyroid, good ROM, no lymphadenopathy Lungs; Clear to auscultation bilaterally Breast:  No dominant palpable mass, retraction, or nipple discharge Cardiovascular: Regular rate and rhythm Abdomen:  Soft, non tender, no hepatosplenomegaly Pelvic:  External genitalia is normal in appearance, no lesions.  The vagina is normal in appearance. Urethra has no lesions or masses. The cervix is not visualized, blind sweep pap with GC/CHL performed.Pt keeping legs together.  Uterus is felt to be normal size, shape, and contour.  No adnexal masses or tenderness noted.Bladder is non tender, no masses felt.but difficult exam secondary to abdominal girth.  Extremities/musculoskeletal:  No swelling or varicosities noted, no clubbing or cyanosis Psych:  No mood changes, alert and cooperative,seems happy PHQ 2 score 0.Has lost 6 lbs since last visit, praised and continue to work on weight.    Impression: 1. Well woman exam with routine gynecological exam   2. Routine cervical smear   3. Encounter for menstrual regulation   4. Polycystic ovarian syndrome   5. Class 3 obesity due to excess calories without serious comorbidity with body mass index (BMI) of 50.0 to 59.9 in adult [facility](HCC)       Plan: Meds ordered this encounter  Medications  . Norethindrone-Ethinyl Estradiol-Fe Biphas (LO LOESTRIN FE) 1 MG-10 MCG / 10 MCG tablet    Sig: Take 1 tablet by mouth daily.    Dispense:  28 tablet    Refill:  12    Order Specific Question:   Supervising Provider    Answer:   Lazaro ArmsEURE, LUTHER H [[id number]]  Physical in 1 year, pap in 3 if normal

## 2016-10-14 LAB — CYTOLOGY - PAP
Chlamydia: NEGATIVE
Diagnosis: NEGATIVE
Neisseria Gonorrhea: NEGATIVE

## 2017-07-05 ENCOUNTER — Encounter: Payer: Self-pay | Admitting: Family Medicine

## 2017-07-05 ENCOUNTER — Ambulatory Visit (INDEPENDENT_AMBULATORY_CARE_PROVIDER_SITE_OTHER): Payer: Medicaid Other | Admitting: Family Medicine

## 2017-07-05 ENCOUNTER — Other Ambulatory Visit: Payer: Self-pay

## 2017-07-05 VITALS — BP 128/78 | HR 100 | Temp 98.6°F | Resp 14 | Ht 61.0 in | Wt 298.0 lb

## 2017-07-05 DIAGNOSIS — L03012 Cellulitis of left finger: Secondary | ICD-10-CM | POA: Diagnosis not present

## 2017-07-05 MED ORDER — TRAMADOL HCL 50 MG PO TABS: 50.0000 mg | ORAL_TABLET | Freq: Two times a day (BID) | ORAL | 0 refills | Status: AC | PRN

## 2017-07-05 MED ORDER — CEPHALEXIN 500 MG PO CAPS: 500.0000 mg | ORAL_CAPSULE | Freq: Four times a day (QID) | ORAL | 0 refills | Status: AC

## 2017-07-05 NOTE — Progress Notes (Signed)
Patient ID: Heidi Clark, female    DOB: 07/01/1990, 27 y.o.   MRN: [id number]  PCP: Dorena Bodoixon, Mary B, PA-C  Chief Complaint  Patient presents with  . Paronychia    x2 weeks- swelling and pain at L 4th digit outer nail bed    Subjective:   Heidi Clark is a 27 y.o. female, presents to clinic with CC of left fourth finger infection around her nail.  Is been there for 2 weeks, she frequently gets these but it normally resolves on its own.  It has swollen and red and severely painful, worse with any light touch but also continues to throb.  She has not tried any treatment including no soaks, medicines.  She denies trying to drain anything herself at home.  She has a history of the paronychia as an hangnails.  Area of redness and swelling at the distal phalanx she denies any redness or swelling going up into her hand she denies any pain in her hand wrist or arm, denies any history of hyperglycemia or diabetes, recurrent wound infections, or immunocompromise.  No fever, chills, sweats, nausea, vomiting.  No numbness or tingling, has normal movement but it does hurt to move.  Patient Active Problem List   Diagnosis Date Noted  . Well woman exam with routine gynecological exam 09/25/2015  . Polycystic ovarian syndrome 08/21/2014  . ADHD (attention deficit hyperactivity disorder)   . Insulin resistance   . Legg-Calve-Perthes disease   . Class 3 obesity due to excess calories without serious comorbidity with body mass index (BMI) of 50.0 to 59.9 in adult   . GERD (gastroesophageal reflux disease) 10/13/2010  . Obesity 10/13/2010     Prior to Admission medications   Medication Sig Start Date End Date Taking? Authorizing Provider  Cholecalciferol 5000 units capsule Take 1 capsule (5,000 Units total) by mouth daily. 10/03/15  Yes Adline PotterGriffin, Jennifer A, NP  Norethindrone-Ethinyl Estradiol-Fe Biphas (LO LOESTRIN FE) 1 MG-10 MCG / 10 MCG tablet Take 1 tablet by mouth daily. 10/12/16  Yes Adline PotterGriffin,  Jennifer A, NP  omeprazole (PRILOSEC) 40 MG capsule Take 1 capsule (40 mg total) by mouth 2 (two) times daily. Patient taking differently: Take 40 mg by mouth 2 (two) times daily as needed (acid reflux).  04/24/14  Yes Dorena Bodoixon, Mary B, PA-C     Allergies  Allergen Reactions  . No Known Allergies      Family History  Problem Relation Age of Onset  . Diabetes Mother   . Obesity Mother   . Colon cancer Neg Hx      Social History   Socioeconomic History  . Marital status: Married    Spouse name: Not on file  . Number of children: Not on file  . Years of education: Not on file  . Highest education level: Not on file  Occupational History  . Not on file  Social Needs  . Financial resource strain: Not on file  . Food insecurity:    Worry: Not on file    Inability: Not on file  . Transportation needs:    Medical: Not on file    Non-medical: Not on file  Tobacco Use  . Smoking status: Never Smoker  . Smokeless tobacco: Never Used  Substance and Sexual Activity  . Alcohol use: No  . Drug use: No  . Sexual activity: Never    Birth control/protection: Other-see comments, Pill    Comment: CELIBATE  Lifestyle  . Physical activity:  Days per week: Not on file    Minutes per session: Not on file  . Stress: Not on file  Relationships  . Social connections:    Talks on phone: Not on file    Gets together: Not on file    Attends religious service: Not on file    Active member of club or organization: Not on file    Attends meetings of clubs or organizations: Not on file    Relationship status: Not on file  . Intimate partner violence:    Fear of current or ex partner: Not on file    Emotionally abused: Not on file    Physically abused: Not on file    Forced sexual activity: Not on file  Other Topics Concern  . Not on file  Social History Narrative  . Not on file     Review of Systems  Constitutional: Negative.   HENT: Negative.   Respiratory: Negative.     Cardiovascular: Negative.   Gastrointestinal: Negative.   Musculoskeletal: Negative for joint swelling.  Skin: Positive for color change. Negative for wound.  Psychiatric/Behavioral: Negative.   All other systems reviewed and are negative.      Objective:    Vitals:   07/05/17 1147  BP: 128/78  Pulse: 100  Resp: 14  Temp: 98.6 F (37 C)  TempSrc: Oral  Weight: 298 lb (135.2 kg)  Height: 5\' 1"  (1.549 m)      Physical Exam  Constitutional: No distress.  Obese female, NAD, well-appearing, but does appear uncomfortable  HENT:  Head: Normocephalic and atraumatic.  Right Ear: External ear normal.  Left Ear: External ear normal.  Eyes: Conjunctivae are normal. Right eye exhibits no discharge. Left eye exhibits no discharge.  Neck: No tracheal deviation present.  Cardiovascular: Normal rate, regular rhythm and intact distal pulses.  Pulmonary/Chest: Effort normal. No stridor. No respiratory distress.  Musculoskeletal: Normal range of motion.       Left hand: She exhibits tenderness and swelling. She exhibits normal range of motion, no bony tenderness, normal capillary refill and no deformity. Normal sensation noted. Normal strength noted.  Left wrist, hand and finger normal range of motion, normal strength, normal sensation to light touch, normal capillary refill Left fourth distal phalanx with erythema and edema on the lateral nail margin, ulnar side, extends around the nail edges roughly 1 cm, does not extend to DIP, normal DIP motion, very tender to palpation over erythematous area, skin tight of the distal phalanx Nails with multiple overgrown cuticles and hangnails Some peeling skin around the area of erythema, but otherwise skin intact, no purulent drainage, no dried blood  Neurological: She is alert. She exhibits normal muscle tone. Coordination normal.  Skin: Skin is warm and dry. Capillary refill takes less than 2 seconds. There is erythema.  Psychiatric: She has a  normal mood and affect. Her behavior is normal.  Nursing note and vitals reviewed.    INCISION AND DRAINAGE Performed by: Danelle Berry PA-C Consent: Verbal consent obtained. Risks and benefits: risks, benefits and alternatives were discussed Type: simple abscess/paronychia  Body area: Left fourth distal finger   Anesthesia: Digital block  Incision was made with sharp scissors and a #11 blade scalpel.  Local anesthetic: 1 % lidocaine w/o epinephrine, bupivacaine  Anesthetic total:  Complexity: simple Blunt dissection to break up loculations  Drainage: purulent  Drainage amount: 1-2 cc  Packing material: not indicated  Patient tolerance: Patient tolerated the procedure well with no immediate complications.  Finger was dressed with gauze and Coban      Assessment & Plan:      ICD-10-CM   1. Paronychia of finger, left L03.012 cephALEXin (KEFLEX) 500 MG capsule    traMADol (ULTRAM) 50 MG tablet    Reviewed paronychia incision and drainage after care including soaks, bandaging, etc.  Will place patient on Keflex to cover skin microbes, discussed nail care, cuticle care and hang nail care.  Advised patient if she is improving at 3 days she can discontinue the antibiotics because I&D should be sufficient for treatment.  She did have some persisting surrounding erythema and edema around the nail and the tissue overall was much softer and less edematous, will cover for possible associated cellulitis.  Will return for I&D recheck in 2 days.  Discussed concerning signs and symptoms to call for recheck earlier or to go to the ER.     Danelle Berry, PA-C 07/05/17 12:16 PM

## 2017-07-05 NOTE — Patient Instructions (Signed)
Take antibiotics for the next 5 days.  Continue warm soaks at least twice a day for the next 3 days to keep the skin open and genuine to drain.  If it does not appear better you can return in 2 to 3 days for incision and drainage recheck.  Return in 1 week if not significant better   Paronychia Paronychia is an infection of the skin that surrounds a nail. It usually affects the skin around a fingernail, but it may also occur near a toenail. It often causes pain and swelling around the nail. This condition may come on suddenly or develop over a longer period. In some cases, a collection of pus (abscess) can form near or under the nail. Usually, paronychia is not serious and it clears up with treatment. What are the causes? This condition may be caused by bacteria or fungi. It is commonly caused by either Streptococcus or Staphylococcus bacteria. The bacteria or fungi often cause the infection by getting into the affected area through an opening in the skin, such as a cut or a hangnail. What increases the risk? This condition is more likely to develop in:  People who get their hands wet often, such as those who work as Fish farm manager, bartenders, or nurses.  People who bite their fingernails or suck their thumbs.  People who trim their nails too short.  People who have hangnails or injured fingertips.  People who get manicures.  People who have diabetes.  What are the signs or symptoms? Symptoms of this condition include:  Redness and swelling of the skin near the nail.  Tenderness around the nail when you touch the area.  Pus-filled bumps under the cuticle. The cuticle is the skin at the base or sides of the nail.  Fluid or pus under the nail.  Throbbing pain in the area.  How is this diagnosed? This condition is usually diagnosed with a physical exam. In some cases, a sample of pus may be taken from an abscess to be tested in a lab. This can help to determine what type of  bacteria or fungi is causing the condition. How is this treated? Treatment for this condition depends on the cause and severity of the condition. If the condition is mild, it may clear up on its own in a few days. Your health care provider may recommend soaking the affected area in warm water a few times a day. When treatment is needed, the options may include:  Antibiotic medicine, if the condition is caused by a bacterial infection.  Antifungal medicine, if the condition is caused by a fungal infection.  Incision and drainage, if an abscess is present. In this procedure, the health care provider will cut open the abscess so the pus can drain out.  Follow these instructions at home:  Soak the affected area in warm water if directed to do so by your health care provider. You may be told to do this for 20 minutes, 2-3 times a day. Keep the area dry in between soakings.  Take medicines only as directed by your health care provider.  If you were prescribed an antibiotic medicine, finish all of it even if you start to feel better.  Keep the affected area clean.  Do not try to drain a fluid-filled bump yourself.  If you will be washing dishes or performing other tasks that require your hands to get wet, wear rubber gloves. You should also wear gloves if your hands might come in contact with  irritating substances, such as cleaners or chemicals.  Follow your health care provider's instructions about: ? Wound care. ? Bandage (dressing) changes and removal. Contact a health care provider if:  Your symptoms get worse or do not improve with treatment.  You have a fever or chills.  You have redness spreading from the affected area.  You have continued or increased fluid, blood, or pus coming from the affected area.  Your finger or knuckle becomes swollen or is difficult to move. This information is not intended to replace advice given to you by your health care provider. Make sure you  discuss any questions you have with your health care provider. Document Released: 08/24/2000 Document Revised: 08/06/2015 Document Reviewed: 02/05/2014 Elsevier Interactive Patient Education  2018 Elsevier Inc.    Incision and Drainage, Care After Refer to this sheet in the next few weeks. These instructions provide you with information about caring for yourself after your procedure. Your health care provider may also give you more specific instructions. Your treatment has been planned according to current medical practices, but problems sometimes occur. Call your health care provider if you have any problems or questions after your procedure. What can I expect after the procedure? After the procedure, it is common to have:  Pain or discomfort around your incision site.  Drainage from your incision.  Follow these instructions at home:  Take over-the-counter and prescription medicines only as told by your health care provider.  If you were prescribed an antibiotic medicine, take it as told by your health care provider.Do not stop taking the antibiotic even if you start to feel better.  Followinstructions from your health care provider about: ? How to take care of your incision. ? When and how you should change your packing and bandage (dressing). Wash your hands with soap and water before you change your dressing. If soap and water are not available, use hand sanitizer. ? When you should remove your dressing.  Do not take baths, swim, or use a hot tub until your health care provider approves.  Keep all follow-up visits as told by your health care provider. This is important.  Check your incision area every day for signs of infection. Check for: ? More redness, swelling, or pain. ? More fluid or blood. ? Warmth. ? Pus or a bad smell. Contact a health care provider if:  Your cyst or abscess returns.  You have a fever.  You have more redness, swelling, or pain around your  incision.  You have more fluid or blood coming from your incision.  Your incision feels warm to the touch.  You have pus or a bad smell coming from your incision. Get help right away if:  You have severe pain or bleeding.  You cannot eat or drink without vomiting.  You have decreased urine output.  You become short of breath.  You have chest pain.  You cough up blood.  The area where the incision and drainage occurred becomes numb or it tingles. This information is not intended to replace advice given to you by your health care provider. Make sure you discuss any questions you have with your health care provider. Document Released: 05/23/2011 Document Revised: 07/31/2015 Document Reviewed: 12/19/2014 Elsevier Interactive Patient Education  [employer]2018 Elsevier Inc.

## 2017-07-07 ENCOUNTER — Ambulatory Visit (INDEPENDENT_AMBULATORY_CARE_PROVIDER_SITE_OTHER): Payer: Medicaid Other | Admitting: Family Medicine

## 2017-07-07 ENCOUNTER — Encounter: Payer: Self-pay | Admitting: Family Medicine

## 2017-07-07 VITALS — BP 110/72 | HR 104 | Temp 97.6°F | Wt 299.5 lb

## 2017-07-07 DIAGNOSIS — Z09 Encounter for follow-up examination after completed treatment for conditions other than malignant neoplasm: Secondary | ICD-10-CM

## 2017-07-07 NOTE — Progress Notes (Signed)
Patient ID: Heidi Clark, female    DOB: Jan 17, 1991, 27 y.o.   MRN: [id number]  PCP: Dorena Bodo, PA-C  Chief Complaint  Patient presents with  . Paronychia of left finger    Patient states finger is doing much better.    Subjective:   Heidi Clark is a 27 y.o. female, presents to clinic for incision and drainage recheck of paronychia of her left ring finger.  She has been doing soaks twice a day.  She did not get her antibiotics until yesterday shows she has only had 24 hours of antibiotics.  She reports 0 out of 10 pain to her finger, swelling has gone down significantly, she has mild healing of the skin around the area of prior infection and swelling.  Skin in that area is still mildly pink.  She has not had any drainage.  She has better range of motion than she did before.  Normal sensation.  No medication side effects.   Patient Active Problem List   Diagnosis Date Noted  . Well woman exam with routine gynecological exam 09/25/2015  . Polycystic ovarian syndrome 08/21/2014  . ADHD (attention deficit hyperactivity disorder)   . Insulin resistance   . Legg-Calve-Perthes disease   . Class 3 obesity due to excess calories without serious comorbidity with body mass index (BMI) of 50.0 to 59.9 in adult   . GERD (gastroesophageal reflux disease) 10/13/2010  . Obesity 10/13/2010     Prior to Admission medications   Medication Sig Start Date End Date Taking? Authorizing Provider  cephALEXin (KEFLEX) 500 MG capsule Take 1 capsule (500 mg total) by mouth 4 (four) times daily for 5 days. 07/05/17 07/10/17 Yes Danelle Berry, PA-C  Cholecalciferol 5000 units capsule Take 1 capsule (5,000 Units total) by mouth daily. 10/03/15  Yes Adline Potter, NP  Norethindrone-Ethinyl Estradiol-Fe Biphas (LO LOESTRIN FE) 1 MG-10 MCG / 10 MCG tablet Take 1 tablet by mouth daily. 10/12/16  Yes Adline Potter, NP  omeprazole (PRILOSEC) 40 MG capsule Take 1 capsule (40 mg total) by mouth 2  (two) times daily. Patient taking differently: Take 40 mg by mouth 2 (two) times daily as needed (acid reflux).  04/24/14  Yes Dorena Bodo, PA-C  traMADol (ULTRAM) 50 MG tablet Take 1 tablet (50 mg total) by mouth every 12 (twelve) hours as needed for up to 2 days. 07/05/17 07/07/17 Yes Danelle Berry, PA-C     Allergies  Allergen Reactions  . No Known Allergies      Family History  Problem Relation Age of Onset  . Diabetes Mother   . Obesity Mother   . Colon cancer Neg Hx      Social History   Socioeconomic History  . Marital status: Married    Spouse name: Not on file  . Number of children: Not on file  . Years of education: Not on file  . Highest education level: Not on file  Occupational History  . Not on file  Social Needs  . Financial resource strain: Not on file  . Food insecurity:    Worry: Not on file    Inability: Not on file  . Transportation needs:    Medical: Not on file    Non-medical: Not on file  Tobacco Use  . Smoking status: Never Smoker  . Smokeless tobacco: Never Used  Substance and Sexual Activity  . Alcohol use: No  . Drug use: No  . Sexual activity: Never  Birth control/protection: Other-see comments, Pill    Comment: CELIBATE  Lifestyle  . Physical activity:    Days per week: Not on file    Minutes per session: Not on file  . Stress: Not on file  Relationships  . Social connections:    Talks on phone: Not on file    Gets together: Not on file    Attends religious service: Not on file    Active member of club or organization: Not on file    Attends meetings of clubs or organizations: Not on file    Relationship status: Not on file  . Intimate partner violence:    Fear of current or ex partner: Not on file    Emotionally abused: Not on file    Physically abused: Not on file    Forced sexual activity: Not on file  Other Topics Concern  . Not on file  Social History Narrative  . Not on file     Review of Systems  All other  systems reviewed and are negative.      Objective:    Vitals:   07/07/17 1118  BP: 110/72  Pulse: (!) 104  Temp: 97.6 F (36.4 C)  TempSrc: Oral  SpO2: 96%  Weight: 299 lb 8 oz (135.9 kg)      Physical Exam  Constitutional: She appears well-developed and well-nourished. No distress.  Obese female, NAD, well-appearing, smilling  HENT:  Head: Normocephalic and atraumatic.  Right Ear: External ear normal.  Left Ear: External ear normal.  Eyes: Conjunctivae are normal. Right eye exhibits no discharge. Left eye exhibits no discharge.  Neck: No tracheal deviation present.  Cardiovascular: Normal rate, regular rhythm and intact distal pulses.  Pulmonary/Chest: Effort normal. No stridor. No respiratory distress.  Musculoskeletal: Normal range of motion.       Left wrist: Normal.       Left hand: She exhibits normal range of motion, no tenderness, no bony tenderness, normal capillary refill, no deformity and no laceration. Normal sensation noted. Normal strength noted.  Neurological: She is alert. She exhibits normal muscle tone. Coordination normal.  Skin: Skin is warm and dry. Capillary refill takes less than 2 seconds. No cyanosis. Nails show no clubbing.  Nails with some hangnails, cuticles appear improved from visit two days ago) skin hygiene and moisture generally appear better. 1x0.5 cm area of peeling skin to ulnar aspect of left 4th finger lateral nail margin, skin pink, very mild persisting swelling, non ttp, no fluctuance, edema, induration, erythema, no drainage   Psychiatric: She has a normal mood and affect. Her behavior is normal.  Nursing note and vitals reviewed.         Assessment & Plan:      ICD-10-CM   1. Encounter for recheck of abscess following incision and drainage Z09     Pt paronychia appears significantly improved, area is nontender, no fluctuance, no induration, appears to be healing normally with some residual peeling skin mild swelling and  pinkness to the skin.  She has no drainage.  She has no medication side effects.  Normal sensation and range of motion.  Discussed with patient and with her father to continue doing soaks for today, she got her antibiotics yesterday so advised her to take at least a 3-day course and then stop if she feels better, and only complete the 5-day course of antibiotics if there is any persisting tenderness redness or swelling that is of concern.  Overall the health of her skin  nails and cuticles appears improved bilaterally.  Last visit skin was excessively dry with lots of hangnails, patient was slightly malodorous.  Today that is all significantly better.  Hope this will prevent any future paronychias.  Discussed stopping nailbiting.   Danelle Berry, PA-C 07/07/17 11:36 AM

## 2017-07-07 NOTE — Patient Instructions (Signed)
Finger infection looks so much better!   Do warm soaks 2 times today and then you can stop.    Continue your antibiotics for the next 2 days (get at least 3 days of meds in) and if it still looks good you can stop.  If there is any persisting redness or tenderness finish the 5 days .  Continue to take better care of your nails, with lotions, creams, good hand hygeine, do not bite nails, carefully groom cuticles.

## 2017-11-11 ENCOUNTER — Other Ambulatory Visit: Payer: Self-pay | Admitting: Adult Health

## 2017-11-14 ENCOUNTER — Ambulatory Visit: Payer: Medicaid Other | Admitting: Adult Health

## 2017-11-30 ENCOUNTER — Other Ambulatory Visit: Payer: Self-pay

## 2017-11-30 ENCOUNTER — Encounter: Payer: Self-pay | Admitting: Adult Health

## 2017-11-30 ENCOUNTER — Encounter: Payer: Medicaid Other | Admitting: Adult Health

## 2017-11-30 ENCOUNTER — Ambulatory Visit: Payer: Medicaid Other | Admitting: Adult Health

## 2017-11-30 ENCOUNTER — Encounter (INDEPENDENT_AMBULATORY_CARE_PROVIDER_SITE_OTHER): Payer: Self-pay

## 2017-11-30 VITALS — BP 128/83 | HR 101 | Ht 61.0 in | Wt 298.0 lb

## 2017-11-30 DIAGNOSIS — Z7689 Persons encountering health services in other specified circumstances: Secondary | ICD-10-CM | POA: Insufficient documentation

## 2017-11-30 DIAGNOSIS — E282 Polycystic ovarian syndrome: Secondary | ICD-10-CM

## 2017-11-30 DIAGNOSIS — Z0001 Encounter for general adult medical examination with abnormal findings: Secondary | ICD-10-CM

## 2017-11-30 DIAGNOSIS — Z01419 Encounter for gynecological examination (general) (routine) without abnormal findings: Secondary | ICD-10-CM

## 2017-11-30 MED ORDER — NORETHIN-ETH ESTRAD-FE BIPHAS 1 MG-10 MCG / 10 MCG PO TABS: 1.0000 | ORAL_TABLET | Freq: Every day | ORAL | 12 refills | Status: DC

## 2017-11-30 NOTE — Progress Notes (Signed)
Patient ID: Heidi Clark, female   DOB: 01/19/1991, 27 y.o.   MRN: [id number] History of Present Illness: Heidi Clark is a 27 year old white female, single, G0P0, in for a well woman gyn exam, she had normal pap 10/12/16.She needs refills on Lo loestrin and periods are good. PCP is Heidi ButcherMary Dixon PA.   Current Medications, Allergies, Past Medical History, Past Surgical History, Family History and Social History were reviewed in [facility] electronic medical record.     Review of Systems: Patient denies any headaches, hearing loss, fatigue, blurred vision, shortness of breath, chest pain, abdominal pain, problems with bowel movements, urination, or intercourse(never had sex). No joint pain or mood swings. Periods are good.    Physical Exam:BP 128/83 (BP Location: Right Arm, Patient Position: Sitting, Cuff Size: Large)   Pulse (!) 101   Ht 5\' 1"  (1.549 m)   Wt 298 lb (135.2 kg)   LMP 11/27/2017   BMI 56.31 kg/m  General:  Well developed, well nourished, no acute distress Skin:  Warm and dry Neck:  Midline trachea, normal thyroid, good ROM, no lymphadenopathy Lungs; Clear to auscultation bilaterally Breast:  No dominant palpable mass, retraction, or nipple discharge Cardiovascular: Regular rate and rhythm Abdomen:  Soft, non tender, no hepatosplenomegaly.obese Pelvic:  External genitalia is normal in appearance, no lesions.  The vagina is normal in appearance. Urethra has no lesions or masses. The cervix is not visualized.Marland Kitchen.  Uterus is felt to be normal size, shape, and contour.  No adnexal masses or tenderness noted.Bladder is non tender, no masses felt. Difficult exam due to abdominal girth. Extremities/musculoskeletal:  No swelling or varicosities noted, no clubbing or cyanosis Psych:  No mood changes, alert and cooperative,seems happy PHQ 2 score 0. Examination chaperoned by Francene FindersKim Lancaster RN.  Impression: 1. Well woman exam with routine gynecological exam   2. Encounter for menstrual  regulation   3. Polycystic ovarian syndrome   4. Morbid obesity (HCC)       Plan: Meds ordered this encounter  Medications  . Norethindrone-Ethinyl Estradiol-Fe Biphas (LO LOESTRIN FE) 1 MG-10 MCG / 10 MCG tablet    Sig: Take 1 tablet by mouth daily.    Dispense:  28 tablet    Refill:  12    Order Specific Question:   Supervising Provider    Answer:   Lazaro ArmsEURE, LUTHER H [[id number]]  Physical in 1 year Pap in 2021 Labs with PCP

## 2017-11-30 NOTE — Progress Notes (Signed)
error 

## 2017-12-01 NOTE — Progress Notes (Signed)
This encounter was created in error - please disregard.

## 2018-03-03 DIAGNOSIS — G44209 Tension-type headache, unspecified, not intractable: Secondary | ICD-10-CM | POA: Diagnosis not present

## 2018-03-03 DIAGNOSIS — H40033 Anatomical narrow angle, bilateral: Secondary | ICD-10-CM | POA: Diagnosis not present

## 2018-03-19 DIAGNOSIS — H5213 Myopia, bilateral: Secondary | ICD-10-CM | POA: Diagnosis not present

## 2018-09-24 ENCOUNTER — Encounter: Payer: Self-pay | Admitting: Family Medicine

## 2018-09-24 ENCOUNTER — Other Ambulatory Visit: Payer: Self-pay

## 2018-09-24 ENCOUNTER — Ambulatory Visit (INDEPENDENT_AMBULATORY_CARE_PROVIDER_SITE_OTHER): Payer: Medicaid Other | Admitting: Family Medicine

## 2018-09-24 VITALS — BP 130/80 | HR 132 | Temp 98.2°F | Resp 22 | Ht 61.0 in | Wt 313.0 lb

## 2018-09-24 DIAGNOSIS — N938 Other specified abnormal uterine and vaginal bleeding: Secondary | ICD-10-CM

## 2018-09-24 DIAGNOSIS — R7309 Other abnormal glucose: Secondary | ICD-10-CM | POA: Diagnosis not present

## 2018-09-24 MED ORDER — NORETHINDRONE-ETH ESTRADIOL 1-35 MG-MCG PO TABS: 1.0000 | ORAL_TABLET | Freq: Every day | ORAL | 11 refills | Status: DC

## 2018-09-24 NOTE — Progress Notes (Signed)
Subjective:    Patient ID: Heidi Clark, female    DOB: 06/24/1990, 28 y.o.   MRN: [id number]  HPI  Patient is a 28 year old Caucasian female with a history of morbid obesity, insulin resistant, and polycystic ovary syndrome.  She is currently on birth control but is only taking 10 mcg of ethynyl estradiol.  She states that at the end of May, her period never stopped.  Instead she had 2 weeks straight of bleeding.  Then her period would stop for 1 or 2 days and the bleeding would start again.  She states that since May, she is essentially had bleeding off and on every day.  History is complicated slightly by her developmental delay.  She denies any abdominal pain or fevers or chills.  She denies any chance of being pregnant.  She denies any nausea or vomiting.  Past Medical History:  Diagnosis Date  . ADHD (attention deficit hyperactivity disorder)   . ADHD (attention deficit hyperactivity disorder)   . Encounter for menstrual regulation 09/25/2015  . GERD (gastroesophageal reflux disease)   . Headache(784.0)   . Insulin resistance   . Obesity   . Polycystic ovarian syndrome    Past Surgical History:  Procedure Laterality Date  . Right hip     Age 51  . TOOTH EXTRACTION N/A 08/12/2016   Procedure: DENTAL RESTORATION/EXTRACTIONS;  Surgeon: Ocie DoyneJensen, Scott, DDS;  Location: [facility] OR;  Service: Oral Surgery;  Laterality: N/A;   Current Outpatient Medications on File Prior to Visit  Medication Sig Dispense Refill  . Cholecalciferol 5000 units capsule Take 1 capsule (5,000 Units total) by mouth daily.    . Norethindrone-Ethinyl Estradiol-Fe Biphas (LO LOESTRIN FE) 1 MG-10 MCG / 10 MCG tablet Take 1 tablet by mouth daily. 28 tablet 12   No current facility-administered medications on file prior to visit.    Allergies  Allergen Reactions  . No Known Allergies    Social History   Socioeconomic History  . Marital status: Married    Spouse name: Not on file  . Number of children: Not on  file  . Years of education: Not on file  . Highest education level: Not on file  Occupational History  . Not on file  Social Needs  . Financial resource strain: Not on file  . Food insecurity    Worry: Not on file    Inability: Not on file  . Transportation needs    Medical: Not on file    Non-medical: Not on file  Tobacco Use  . Smoking status: Never Smoker  . Smokeless tobacco: Never Used  Substance and Sexual Activity  . Alcohol use: No  . Drug use: No  . Sexual activity: Never    Birth control/protection: Other-see comments, Pill    Comment: CELIBATE  Lifestyle  . Physical activity    Days per week: Not on file    Minutes per session: Not on file  . Stress: Not on file  Relationships  . Social Musicianconnections    Talks on phone: Not on file    Gets together: Not on file    Attends religious service: Not on file    Active member of club or organization: Not on file    Attends meetings of clubs or organizations: Not on file    Relationship status: Not on file  . Intimate partner violence    Fear of current or ex partner: Not on file    Emotionally abused: Not on file  Physically abused: Not on file    Forced sexual activity: Not on file  Other Topics Concern  . Not on file  Social History Narrative  . Not on file      Review of Systems  All other systems reviewed and are negative.      Objective:   Physical Exam Vitals signs reviewed.  Constitutional:      General: She is not in acute distress.    Appearance: She is obese. She is not ill-appearing or toxic-appearing.  Cardiovascular:     Rate and Rhythm: Normal rate and regular rhythm.     Heart sounds: No murmur. No gallop.   Pulmonary:     Effort: Pulmonary effort is normal. No respiratory distress.     Breath sounds: Normal breath sounds. No stridor. No wheezing or rhonchi.  Musculoskeletal:     Right lower leg: No edema.     Left lower leg: No edema.  Neurological:     Mental Status: She is  alert.           Assessment & Plan:  1. DUB (dysfunctional uterine bleeding) Check CBC to rule out severe anemia.  If there is severe anemia, may consider 10 days of Provera and then a withdrawal bleed prior to instituting new birth control.  However if her CBC is normal and her TSH CMP and beta-hCG levels are normal, I would recommend switching her birth control to Ortho-Novum which has 35 mcg of ethinyl estradiol which may be more appropriate for her BMI - CBC with Differential/Platelet - TSH - COMPLETE METABOLIC PANEL WITH GFR - hCG, serum, qualitative

## 2018-09-26 LAB — COMPLETE METABOLIC PANEL WITH GFR
AG Ratio: 1.3 (calc) (ref 1.0–2.5)
ALT: 15 U/L (ref 6–29)
AST: 21 U/L (ref 10–30)
Albumin: 4 g/dL (ref 3.6–5.1)
Alkaline phosphatase (APISO): 84 U/L (ref 31–125)
BUN: 11 mg/dL (ref 7–25)
CO2: 22 mmol/L (ref 20–32)
Calcium: 9.6 mg/dL (ref 8.6–10.2)
Chloride: 106 mmol/L (ref 98–110)
Creat: 0.64 mg/dL (ref 0.50–1.10)
GFR, Est African American: 143 mL/min/{1.73_m2} (ref >=60)
GFR, Est Non African American: 123 mL/min/{1.73_m2} (ref >=60)
Globulin: 3.1 g/dL (calc) (ref 1.9–3.7)
Glucose, Bld: 149 mg/dL — ABNORMAL HIGH (ref 65–99)
Potassium: 4.1 mmol/L (ref 3.5–5.3)
Sodium: 142 mmol/L (ref 135–146)
Total Bilirubin: 0.4 mg/dL (ref 0.2–1.2)
Total Protein: 7.1 g/dL (ref 6.1–8.1)

## 2018-09-26 LAB — TEST AUTHORIZATION

## 2018-09-26 LAB — CBC WITH DIFFERENTIAL/PLATELET
Absolute Monocytes: 466 cells/uL (ref 200–950)
Basophils Absolute: 56 cells/uL (ref 0–200)
Basophils Relative: 0.5 %
Eosinophils Absolute: 189 cells/uL (ref 15–500)
Eosinophils Relative: 1.7 %
HCT: 44.1 % (ref 35.0–45.0)
Hemoglobin: 15 g/dL (ref 11.7–15.5)
Lymphs Abs: 2686 cells/uL (ref 850–3900)
MCH: 28.7 pg (ref 27.0–33.0)
MCHC: 34 g/dL (ref 32.0–36.0)
MCV: 84.5 fL (ref 80.0–100.0)
MPV: 10.1 fL (ref 7.5–12.5)
Monocytes Relative: 4.2 %
Neutro Abs: 7703 cells/uL (ref 1500–7800)
Neutrophils Relative %: 69.4 %
Platelets: 327 10*3/uL (ref 140–400)
RBC: 5.22 10*6/uL — ABNORMAL HIGH (ref 3.80–5.10)
RDW: 13.3 % (ref 11.0–15.0)
Total Lymphocyte: 24.2 %
WBC: 11.1 10*3/uL — ABNORMAL HIGH (ref 3.8–10.8)

## 2018-09-26 LAB — HCG, SERUM, QUALITATIVE: Preg, Serum: NEGATIVE

## 2018-09-26 LAB — TSH: TSH: 3.27 mIU/L

## 2018-09-26 LAB — HEMOGLOBIN A1C W/OUT EAG: Hgb A1c MFr Bld: 5.7 % of total Hgb — ABNORMAL HIGH (ref <5.7)

## 2018-09-27 ENCOUNTER — Encounter: Payer: Self-pay | Admitting: Family Medicine

## 2018-11-05 ENCOUNTER — Ambulatory Visit: Payer: Medicaid Other | Admitting: Family Medicine

## 2018-11-26 ENCOUNTER — Ambulatory Visit: Payer: Medicaid Other | Admitting: Family Medicine

## 2018-12-13 DIAGNOSIS — Z23 Encounter for immunization: Secondary | ICD-10-CM | POA: Diagnosis not present

## 2019-02-28 DIAGNOSIS — H40033 Anatomical narrow angle, bilateral: Secondary | ICD-10-CM | POA: Diagnosis not present

## 2019-02-28 DIAGNOSIS — G44209 Tension-type headache, unspecified, not intractable: Secondary | ICD-10-CM | POA: Diagnosis not present

## 2019-04-01 ENCOUNTER — Encounter (INDEPENDENT_AMBULATORY_CARE_PROVIDER_SITE_OTHER): Payer: Medicaid Other | Admitting: Family Medicine

## 2019-04-01 NOTE — Progress Notes (Signed)
Called [phone] (only working number listed in her chart) 10:02 LVM 10:09 LVM 10:16 LVM- Asked patient to call back whenever she gets these messages and reschedule as we have missed her appointment time. Tried again, no answer at 11:04.  Will stop trying at this point.

## 2019-04-02 ENCOUNTER — Other Ambulatory Visit: Payer: Self-pay

## 2019-04-02 ENCOUNTER — Other Ambulatory Visit: Payer: Self-pay | Admitting: Family Medicine

## 2019-04-02 ENCOUNTER — Ambulatory Visit (INDEPENDENT_AMBULATORY_CARE_PROVIDER_SITE_OTHER): Payer: Medicaid Other | Admitting: Family Medicine

## 2019-04-02 DIAGNOSIS — R059 Cough, unspecified: Secondary | ICD-10-CM

## 2019-04-02 DIAGNOSIS — R05 Cough: Secondary | ICD-10-CM | POA: Diagnosis not present

## 2019-04-02 MED ORDER — HYDROCODONE-HOMATROPINE 5-1.5 MG/5ML PO SYRP: 5.0000 mL | ORAL_SOLUTION | Freq: Three times a day (TID) | ORAL | 0 refills | Status: DC | PRN

## 2019-04-02 NOTE — Progress Notes (Signed)
Subjective:    Patient ID: Heidi Clark, female    DOB: [birth year], 29 y.o.   MRN: [id number]  HPI Patient is a 29 year old Caucasian female being seen today as a telephone visit along with her mother.  Phone call began at 1015.  Phone call concluded at 1030.  Patient reports a 1 week history of cough.  The cough is nonproductive.  She denies any shortness of breath.  She denies any fever.  She does report some central chest pleurisy with coughing and deep inspiration.  She has some mild rhinorrhea.  She denies any sore throat.  She denies any loss in her sense of taste or smell.  She denies any nausea or vomiting or diarrhea.  Symptoms sound consistent with a viral upper respiratory infection. Past Medical History:  Diagnosis Date  . ADHD (attention deficit hyperactivity disorder)   . ADHD (attention deficit hyperactivity disorder)   . Encounter for menstrual regulation 09/25/2015  . GERD (gastroesophageal reflux disease)   . Headache(784.0)   . Insulin resistance   . Obesity   . Polycystic ovarian syndrome    Past Surgical History:  Procedure Laterality Date  . Right hip     Age 29  . TOOTH EXTRACTION N/A 08/12/2016   Procedure: DENTAL RESTORATION/EXTRACTIONS;  Surgeon: Ocie Doyne, DDS;  Location: [facility] OR;  Service: Oral Surgery;  Laterality: N/A;   Current Outpatient Medications on File Prior to Visit  Medication Sig Dispense Refill  . Cholecalciferol 5000 units capsule Take 1 capsule (5,000 Units total) by mouth daily.    . norethindrone-ethinyl estradiol 1/35 (ORTHO-NOVUM) tablet Take 1 tablet by mouth daily. 1 Package 11  . Norethindrone-Ethinyl Estradiol-Fe Biphas (LO LOESTRIN FE) 1 MG-10 MCG / 10 MCG tablet Take 1 tablet by mouth daily. 28 tablet 12   No current facility-administered medications on file prior to visit.   Allergies  Allergen Reactions  . No Known Allergies    Social History   Socioeconomic History  . Marital status: Married    Spouse name: Not on  file  . Number of children: Not on file  . Years of education: Not on file  . Highest education level: Not on file  Occupational History  . Not on file  Tobacco Use  . Smoking status: Never Smoker  . Smokeless tobacco: Never Used  Substance and Sexual Activity  . Alcohol use: No  . Drug use: No  . Sexual activity: Never    Birth control/protection: Other-see comments, Pill    Comment: CELIBATE  Other Topics Concern  . Not on file  Social History Narrative  . Not on file   Social Determinants of Health   Financial Resource Strain:   . Difficulty of Paying Living Expenses: Not on file  Food Insecurity:   . Worried About Programme researcher, broadcasting/film/video in the Last Year: Not on file  . Ran Out of Food in the Last Year: Not on file  Transportation Needs:   . Lack of Transportation (Medical): Not on file  . Lack of Transportation (Non-Medical): Not on file  Physical Activity:   . Days of Exercise per Week: Not on file  . Minutes of Exercise per Session: Not on file  Stress:   . Feeling of Stress : Not on file  Social Connections:   . Frequency of Communication with Friends and Family: Not on file  . Frequency of Social Gatherings with Friends and Family: Not on file  . Attends Religious Services: Not on file  .  Active Member of Clubs or Organizations: Not on file  . Attends Archivist Meetings: Not on file  . Marital Status: Not on file  Intimate Partner Violence:   . Fear of Current or Ex-Partner: Not on file  . Emotionally Abused: Not on file  . Physically Abused: Not on file  . Sexually Abused: Not on file      Review of Systems  All other systems reviewed and are negative.      Objective:   Physical Exam        Assessment & Plan:  Cough  Patient symptoms sound like a viral upper respiratory infection.  I have recommended tincture of time as long she does not have shortness of breath or chest pain.  She can use Hycodan 1 teaspoon every 6-8 hours as needed  for cough.  Anticipate symptoms will gradually improve over the next 2 to 3 days.  I did recommend that the patient be tested for COVID-19.  I provided both she and her mother with the contact information.  Recommended quarantining at home until the results have returned.

## 2019-04-04 ENCOUNTER — Ambulatory Visit: Payer: Medicaid Other | Attending: Internal Medicine

## 2019-04-04 DIAGNOSIS — Z20822 Contact with and (suspected) exposure to covid-19: Secondary | ICD-10-CM

## 2019-04-05 LAB — NOVEL CORONAVIRUS, NAA: SARS-CoV-2, NAA: NOT DETECTED

## 2019-04-09 ENCOUNTER — Telehealth: Payer: Self-pay | Admitting: Family Medicine

## 2019-04-09 NOTE — Telephone Encounter (Signed)
Patient informed of negative covid-19 result. Patient verbalized understanding.  °

## 2019-04-17 ENCOUNTER — Telehealth: Payer: Self-pay | Admitting: Family Medicine

## 2019-04-17 MED ORDER — HYDROCODONE-HOMATROPINE 5-1.5 MG/5ML PO SYRP: 5.0000 mL | ORAL_SOLUTION | Freq: Three times a day (TID) | ORAL | 0 refills | Status: DC | PRN

## 2019-04-17 NOTE — Telephone Encounter (Signed)
Patient tested negative for covid, however is still coughing would like to know if she can get refill on cough medication [facility]

## 2019-04-17 NOTE — Telephone Encounter (Signed)
Requesting refill    Hycodan  LOV: 04/02/19 Phone visit - neg covid  LRF:  04/02/19

## 2019-04-17 NOTE — Telephone Encounter (Signed)
Tried to call line busy 

## 2019-04-17 NOTE — Telephone Encounter (Signed)
Will give 1 more refill, if cough still present needs to be re-evaluated

## 2019-04-18 DIAGNOSIS — H1013 Acute atopic conjunctivitis, bilateral: Secondary | ICD-10-CM | POA: Diagnosis not present

## 2019-04-18 NOTE — Telephone Encounter (Signed)
Tried to call no answer and no vm 

## 2019-04-22 ENCOUNTER — Ambulatory Visit (INDEPENDENT_AMBULATORY_CARE_PROVIDER_SITE_OTHER): Payer: Medicaid Other | Admitting: Family Medicine

## 2019-04-22 ENCOUNTER — Ambulatory Visit
Admission: RE | Admit: 2019-04-22 | Discharge: 2019-04-22 | Disposition: A | Discharge status: Home / Self Care | Payer: Medicaid Other | Source: Ambulatory Visit | Attending: Family Medicine | Admitting: Family Medicine

## 2019-04-22 ENCOUNTER — Other Ambulatory Visit: Payer: Self-pay

## 2019-04-22 DIAGNOSIS — R05 Cough: Secondary | ICD-10-CM | POA: Diagnosis not present

## 2019-04-22 DIAGNOSIS — J209 Acute bronchitis, unspecified: Secondary | ICD-10-CM

## 2019-04-22 MED ORDER — AZITHROMYCIN 250 MG PO TABS: ORAL_TABLET | ORAL | 0 refills | Status: DC

## 2019-04-22 NOTE — Progress Notes (Signed)
Subjective:    Patient ID: Heidi Clark, female    DOB: 1990/04/07, 29 y.o.   MRN: [id number]  HPI  04/02/19 Patient is a 29 year old Caucasian female being seen today as a telephone visit along with her mother.  Phone call began at 1015.  Phone call concluded at 1030.  Patient reports a 1 week history of cough.  The cough is nonproductive.  She denies any shortness of breath.  She denies any fever.  She does report some central chest pleurisy with coughing and deep inspiration.  She has some mild rhinorrhea.  She denies any sore throat.  She denies any loss in her sense of taste or smell.  She denies any nausea or vomiting or diarrhea.  Symptoms sound consistent with a viral upper respiratory infection.  At that time, my plan was: Patient symptoms sound like a viral upper respiratory infection.  I have recommended tincture of time as long she does not have shortness of breath or chest pain.  She can use Hycodan 1 teaspoon every 6-8 hours as needed for cough.  Anticipate symptoms will gradually improve over the next 2 to 3 days.  I did recommend that the patient be tested for COVID-19.  I provided both she and her mother with the contact information.  Recommended quarantining at home until the results have returned.  04/22/19 Tested negative for COVID 04/04/19.  Called 04/17/19 requesting more hycodan.  Patient's mother states that she continues to cough constantly.  The cough is nonproductive.  She denies any fever.  She denies any purulent sputum however she does report chest congestion.  She denies any wheezing.  She denies any pleurisy.  She denies any chest pain.  She denies any hemoptysis.  She denies any sore throat or rhinorrhea or sinus congestion.  She has been taking Hycodan regularly without relief.  Past Medical History:  Diagnosis Date  . ADHD (attention deficit hyperactivity disorder)   . ADHD (attention deficit hyperactivity disorder)   . Encounter for menstrual regulation 09/25/2015    . GERD (gastroesophageal reflux disease)   . Headache(784.0)   . Insulin resistance   . Obesity   . Polycystic ovarian syndrome    Past Surgical History:  Procedure Laterality Date  . Right hip     Age 33  . TOOTH EXTRACTION N/A 08/12/2016   Procedure: DENTAL RESTORATION/EXTRACTIONS;  Surgeon: Diona Browner, DDS;  Location: [place];  Service: Oral Surgery;  Laterality: N/A;   Current Outpatient Medications on File Prior to Visit  Medication Sig Dispense Refill  . Cholecalciferol 5000 units capsule Take 1 capsule (5,000 Units total) by mouth daily.    Marland Kitchen HYDROcodone-homatropine (HYCODAN) 5-1.5 MG/5ML syrup Take 5 mLs by mouth every 8 (eight) hours as needed for cough. 120 mL 0  . norethindrone-ethinyl estradiol 1/35 (ORTHO-NOVUM) tablet Take 1 tablet by mouth daily. 1 Package 11  . Norethindrone-Ethinyl Estradiol-Fe Biphas (LO LOESTRIN FE) 1 MG-10 MCG / 10 MCG tablet Take 1 tablet by mouth daily. 28 tablet 12   No current facility-administered medications on file prior to visit.   Allergies  Allergen Reactions  . No Known Allergies    Social History   Socioeconomic History  . Marital status: Married    Spouse name: Not on file  . Number of children: Not on file  . Years of education: Not on file  . Highest education level: Not on file  Occupational History  . Not on file  Tobacco Use  . Smoking status:  Never Smoker  . Smokeless tobacco: Never Used  Substance and Sexual Activity  . Alcohol use: No  . Drug use: No  . Sexual activity: Never    Birth control/protection: Other-see comments, Pill    Comment: CELIBATE  Other Topics Concern  . Not on file  Social History Narrative  . Not on file   Social Determinants of Health   Financial Resource Strain:   . Difficulty of Paying Living Expenses: Not on file  Food Insecurity:   . Worried About Programme researcher, broadcasting/film/video in the Last Year: Not on file  . Ran Out of Food in the Last Year: Not on file  Transportation Needs:   . Lack  of Transportation (Medical): Not on file  . Lack of Transportation (Non-Medical): Not on file  Physical Activity:   . Days of Exercise per Week: Not on file  . Minutes of Exercise per Session: Not on file  Stress:   . Feeling of Stress : Not on file  Social Connections:   . Frequency of Communication with Friends and Family: Not on file  . Frequency of Social Gatherings with Friends and Family: Not on file  . Attends Religious Services: Not on file  . Active Member of Clubs or Organizations: Not on file  . Attends Banker Meetings: Not on file  . Marital Status: Not on file  Intimate Partner Violence:   . Fear of Current or Ex-Partner: Not on file  . Emotionally Abused: Not on file  . Physically Abused: Not on file  . Sexually Abused: Not on file      Review of Systems  All other systems reviewed and are negative.      Objective:   Physical Exam   Patient is coughing frequently in the background.     Assessment & Plan:  Acute bronchitis, unspecified organism - Plan: DG Chest 2 View, azithromycin (ZITHROMAX) 250 MG tablet  Patient has now had a cough for almost 3 weeks.  The cough is worsening.  She tested negative for Covid.  Therefore I believe the patient likely has bronchitis.  I will give the patient a Z-Pak and I recommended she get a chest x-ray to rule out a community-acquired pneumonia given the fact the symptoms are worse despite taking.

## 2019-07-29 ENCOUNTER — Other Ambulatory Visit: Payer: Self-pay | Admitting: Family Medicine

## 2019-08-01 DIAGNOSIS — Z23 Encounter for immunization: Secondary | ICD-10-CM | POA: Diagnosis not present

## 2019-08-22 DIAGNOSIS — Z23 Encounter for immunization: Secondary | ICD-10-CM | POA: Diagnosis not present

## 2019-08-30 ENCOUNTER — Ambulatory Visit (INDEPENDENT_AMBULATORY_CARE_PROVIDER_SITE_OTHER): Payer: Medicaid Other | Admitting: Family Medicine

## 2019-08-30 ENCOUNTER — Other Ambulatory Visit: Payer: Self-pay

## 2019-08-30 VITALS — BP 110/70 | HR 115 | Temp 98.2°F | Wt 297.0 lb

## 2019-08-30 DIAGNOSIS — J209 Acute bronchitis, unspecified: Secondary | ICD-10-CM

## 2019-08-30 MED ORDER — HYDROCODONE-HOMATROPINE 5-1.5 MG/5ML PO SYRP: 5.0000 mL | ORAL_SOLUTION | Freq: Three times a day (TID) | ORAL | 0 refills | Status: DC | PRN

## 2019-08-30 MED ORDER — AZITHROMYCIN 250 MG PO TABS: ORAL_TABLET | ORAL | 0 refills | Status: DC

## 2019-08-30 NOTE — Progress Notes (Signed)
Subjective:    Patient ID: Heidi Clark, female    DOB: 03-26-1990, 29 y.o.   MRN: [id number]  HPI  Patient is a 29 year old Caucasian female with a history of morbid obesity, insulin resistance, and polycystic ovary syndrome.  She presents today with her family reporting a cough for 2 to 3 weeks.  She denies any chest pain.  She denies any pleurisy.  She denies any shortness of breath.  However she does report cough productive of clear mucus.  She has been using cough medicine over the last week with no improvement.  Today on exam she has a few scattered wheezes and a prolonged expiratory phase.  She has no previous history of asthma.  However she was treated for bronchitis back in February.  This would be the second event in the last 4 months.  Family member states that the cough stopped shortly after she took the antibiotic in February.  She denies any acid reflux.  She denies any wheezing although I can hear wheezing on her exam today.  She does have a runny nose.  She denies any itchy watery eyes or history of allergies.  Family is certain that the cough subsided in February and that this is only the second exacerbation since then.   CXR 04/22/19 was clear.  Past Medical History:  Diagnosis Date  . ADHD (attention deficit hyperactivity disorder)   . ADHD (attention deficit hyperactivity disorder)   . Encounter for menstrual regulation 09/25/2015  . GERD (gastroesophageal reflux disease)   . Headache(784.0)   . Insulin resistance   . Obesity   . Polycystic ovarian syndrome    Past Surgical History:  Procedure Laterality Date  . Right hip     Age 29  . TOOTH EXTRACTION N/A 08/12/2016   Procedure: DENTAL RESTORATION/EXTRACTIONS;  Surgeon: Ocie Doyne, DDS;  Location: [facility] OR;  Service: Oral Surgery;  Laterality: N/A;   Current Outpatient Medications on File Prior to Visit  Medication Sig Dispense Refill  . ALAYCEN 1/35 tablet TAKE 1 TABLET BY MOUTH EVERY DAY 28 tablet 11  .  azithromycin (ZITHROMAX) 250 MG tablet 2 tabs poqday1, 1 tab poqday 2-5 6 tablet 0  . Cholecalciferol 5000 units capsule Take 1 capsule (5,000 Units total) by mouth daily.    Marland Kitchen HYDROcodone-homatropine (HYCODAN) 5-1.5 MG/5ML syrup Take 5 mLs by mouth every 8 (eight) hours as needed for cough. 120 mL 0  . Norethindrone-Ethinyl Estradiol-Fe Biphas (LO LOESTRIN FE) 1 MG-10 MCG / 10 MCG tablet Take 1 tablet by mouth daily. 28 tablet 12   No current facility-administered medications on file prior to visit.   Allergies  Allergen Reactions  . No Known Allergies    Social History   Socioeconomic History  . Marital status: Married    Spouse name: Not on file  . Number of children: Not on file  . Years of education: Not on file  . Highest education level: Not on file  Occupational History  . Not on file  Tobacco Use  . Smoking status: Never Smoker  . Smokeless tobacco: Never Used  Vaping Use  . Vaping Use: Never used  Substance and Sexual Activity  . Alcohol use: No  . Drug use: No  . Sexual activity: Never    Birth control/protection: Other-see comments, Pill    Comment: CELIBATE  Other Topics Concern  . Not on file  Social History Narrative  . Not on file   Social Determinants of Health   Financial Resource  Strain:   . Difficulty of Paying Living Expenses:   Food Insecurity:   . Worried About Charity fundraiser in the Last Year:   . Arboriculturist in the Last Year:   Transportation Needs:   . Film/video editor (Medical):   Marland Kitchen Lack of Transportation (Non-Medical):   Physical Activity:   . Days of Exercise per Week:   . Minutes of Exercise per Session:   Stress:   . Feeling of Stress :   Social Connections:   . Frequency of Communication with Friends and Family:   . Frequency of Social Gatherings with Friends and Family:   . Attends Religious Services:   . Active Member of Clubs or Organizations:   . Attends Archivist Meetings:   Marland Kitchen Marital Status:     Intimate Partner Violence:   . Fear of Current or Ex-Partner:   . Emotionally Abused:   Marland Kitchen Physically Abused:   . Sexually Abused:       Review of Systems  All other systems reviewed and are negative.      Objective:   Physical Exam Vitals reviewed.  Constitutional:      General: She is not in acute distress.    Appearance: She is obese. She is not ill-appearing or toxic-appearing.  HENT:     Right Ear: Tympanic membrane and ear canal normal.     Left Ear: Tympanic membrane and ear canal normal.     Nose: Rhinorrhea present.  Eyes:     Conjunctiva/sclera: Conjunctivae normal.  Cardiovascular:     Rate and Rhythm: Normal rate and regular rhythm.     Heart sounds: No murmur heard.  No gallop.   Pulmonary:     Effort: Pulmonary effort is normal. No respiratory distress.     Breath sounds: No stridor. Wheezing present. No rhonchi or rales.  Musculoskeletal:     Cervical back: Neck supple.     Right lower leg: No edema.     Left lower leg: No edema.  Lymphadenopathy:     Cervical: No cervical adenopathy.  Neurological:     Mental Status: She is alert.           Assessment & Plan:  Acute bronchitis, unspecified organism  Patient appears to have her second incidence of bronchitis.  Begin a Z-Pak given the fact the cough has been persistent for 3 weeks and is getting worse.  Add Hycodan 1 teaspoon every 6 hours as needed for cough.  Consider reactive airway disease or even mild intermittent asthma if cough persist.  Also given elevated BMI consider laryngoesophageal reflux as a potential contributor to cough.

## 2019-09-02 ENCOUNTER — Ambulatory Visit: Payer: Medicaid Other | Admitting: Adult Health

## 2019-09-02 ENCOUNTER — Other Ambulatory Visit (HOSPITAL_COMMUNITY)
Admission: RE | Admit: 2019-09-02 | Discharge: 2019-09-02 | Disposition: A | Discharge status: Home / Self Care | Payer: Medicaid Other | Source: Ambulatory Visit | Attending: Adult Health | Admitting: Adult Health

## 2019-09-02 ENCOUNTER — Encounter: Payer: Self-pay | Admitting: Adult Health

## 2019-09-02 VITALS — BP 155/96 | HR 111 | Ht 61.0 in | Wt 300.8 lb

## 2019-09-02 DIAGNOSIS — E282 Polycystic ovarian syndrome: Secondary | ICD-10-CM

## 2019-09-02 DIAGNOSIS — R030 Elevated blood-pressure reading, without diagnosis of hypertension: Secondary | ICD-10-CM | POA: Diagnosis not present

## 2019-09-02 DIAGNOSIS — Z7689 Persons encountering health services in other specified circumstances: Secondary | ICD-10-CM

## 2019-09-02 DIAGNOSIS — Z01419 Encounter for gynecological examination (general) (routine) without abnormal findings: Secondary | ICD-10-CM | POA: Insufficient documentation

## 2019-09-02 DIAGNOSIS — Z0000 Encounter for general adult medical examination without abnormal findings: Secondary | ICD-10-CM

## 2019-09-02 HISTORY — DX: Elevated blood-pressure reading, without diagnosis of hypertension: R03.0

## 2019-09-02 MED ORDER — LO LOESTRIN FE 1 MG-10 MCG / 10 MCG PO TABS: 1.0000 | ORAL_TABLET | Freq: Every day | ORAL | 12 refills | Status: DC

## 2019-09-02 NOTE — Patient Instructions (Signed)
DASH Eating Plan DASH stands for "Dietary Approaches to Stop Hypertension." The DASH eating plan is a healthy eating plan that has been shown to reduce high blood pressure (hypertension). It may also reduce your risk for type 2 diabetes, heart disease, and stroke. The DASH eating plan may also help with weight loss. What are tips for following this plan?  General guidelines  Avoid eating more than 2,300 mg (milligrams) of salt (sodium) a day. If you have hypertension, you may need to reduce your sodium intake to 1,500 mg a day.  Limit alcohol intake to no more than 1 drink a day for nonpregnant women and 2 drinks a day for men. One drink equals 12 oz of beer, 5 oz of wine, or 1 oz of hard liquor.  Work with your health care provider to maintain a healthy body weight or to lose weight. Ask what an ideal weight is for you.  Get at least 30 minutes of exercise that causes your heart to beat faster (aerobic exercise) most days of the week. Activities may include walking, swimming, or biking.  Work with your health care provider or diet and nutrition specialist (dietitian) to adjust your eating plan to your individual calorie needs. Reading food labels   Check food labels for the amount of sodium per serving. Choose foods with less than 5 percent of the Daily Value of sodium. Generally, foods with less than 300 mg of sodium per serving fit into this eating plan.  To find whole grains, look for the word "whole" as the first word in the ingredient list. Shopping  Buy products labeled as "low-sodium" or "no salt added."  Buy fresh foods. Avoid canned foods and premade or frozen meals. Cooking  Avoid adding salt when cooking. Use salt-free seasonings or herbs instead of table salt or sea salt. Check with your health care provider or pharmacist before using salt substitutes.  Do not fry foods. Cook foods using healthy methods such as baking, boiling, grilling, and broiling instead.  Cook with  heart-healthy oils, such as olive, canola, soybean, or sunflower oil. Meal planning  Eat a balanced diet that includes: ? 5 or more servings of fruits and vegetables each day. At each meal, try to fill half of your plate with fruits and vegetables. ? Up to 6-8 servings of whole grains each day. ? Less than 6 oz of lean meat, poultry, or fish each day. A 3-oz serving of meat is about the same size as a deck of cards. One egg equals 1 oz. ? 2 servings of low-fat dairy each day. ? A serving of nuts, seeds, or beans 5 times each week. ? Heart-healthy fats. Healthy fats called Omega-3 fatty acids are found in foods such as flaxseeds and coldwater fish, like sardines, salmon, and mackerel.  Limit how much you eat of the following: ? Canned or prepackaged foods. ? Food that is high in trans fat, such as fried foods. ? Food that is high in saturated fat, such as fatty meat. ? Sweets, desserts, sugary drinks, and other foods with added sugar. ? Full-fat dairy products.  Do not salt foods before eating.  Try to eat at least 2 vegetarian meals each week.  Eat more home-cooked food and less restaurant, buffet, and fast food.  When eating at a restaurant, ask that your food be prepared with less salt or no salt, if possible. What foods are recommended? The items listed may not be a complete list. Talk with your dietitian about   what dietary choices are best for you. Grains Whole-grain or whole-wheat bread. Whole-grain or whole-wheat pasta. Brown rice. Oatmeal. Quinoa. Bulgur. Whole-grain and low-sodium cereals. Pita bread. Low-fat, low-sodium crackers. Whole-wheat flour tortillas. Vegetables Fresh or frozen vegetables (raw, steamed, roasted, or grilled). Low-sodium or reduced-sodium tomato and vegetable juice. Low-sodium or reduced-sodium tomato sauce and tomato paste. Low-sodium or reduced-sodium canned vegetables. Fruits All fresh, dried, or frozen fruit. Canned fruit in natural juice (without  added sugar). Meat and other protein foods Skinless chicken or turkey. Ground chicken or turkey. Pork with fat trimmed off. Fish and seafood. Egg whites. Dried beans, peas, or lentils. Unsalted nuts, nut butters, and seeds. Unsalted canned beans. Lean cuts of beef with fat trimmed off. Low-sodium, lean deli meat. Dairy Low-fat (1%) or fat-free (skim) milk. Fat-free, low-fat, or reduced-fat cheeses. Nonfat, low-sodium ricotta or cottage cheese. Low-fat or nonfat yogurt. Low-fat, low-sodium cheese. Fats and oils Soft margarine without trans fats. Vegetable oil. Low-fat, reduced-fat, or light mayonnaise and salad dressings (reduced-sodium). Canola, safflower, olive, soybean, and sunflower oils. Avocado. Seasoning and other foods Herbs. Spices. Seasoning mixes without salt. Unsalted popcorn and pretzels. Fat-free sweets. What foods are not recommended? The items listed may not be a complete list. Talk with your dietitian about what dietary choices are best for you. Grains Baked goods made with fat, such as croissants, muffins, or some breads. Dry pasta or rice meal packs. Vegetables Creamed or fried vegetables. Vegetables in a cheese sauce. Regular canned vegetables (not low-sodium or reduced-sodium). Regular canned tomato sauce and paste (not low-sodium or reduced-sodium). Regular tomato and vegetable juice (not low-sodium or reduced-sodium). Pickles. Olives. Fruits Canned fruit in a light or heavy syrup. Fried fruit. Fruit in cream or butter sauce. Meat and other protein foods Fatty cuts of meat. Ribs. Fried meat. Bacon. Sausage. Bologna and other processed lunch meats. Salami. Fatback. Hotdogs. Bratwurst. Salted nuts and seeds. Canned beans with added salt. Canned or smoked fish. Whole eggs or egg yolks. Chicken or turkey with skin. Dairy Whole or 2% milk, cream, and half-and-half. Whole or full-fat cream cheese. Whole-fat or sweetened yogurt. Full-fat cheese. Nondairy creamers. Whipped toppings.  Processed cheese and cheese spreads. Fats and oils Butter. Stick margarine. Lard. Shortening. Ghee. Bacon fat. Tropical oils, such as coconut, palm kernel, or palm oil. Seasoning and other foods Salted popcorn and pretzels. Onion salt, garlic salt, seasoned salt, table salt, and sea salt. Worcestershire sauce. Tartar sauce. Barbecue sauce. Teriyaki sauce. Soy sauce, including reduced-sodium. Steak sauce. Canned and packaged gravies. Fish sauce. Oyster sauce. Cocktail sauce. Horseradish that you find on the shelf. Ketchup. Mustard. Meat flavorings and tenderizers. Bouillon cubes. Hot sauce and Tabasco sauce. Premade or packaged marinades. Premade or packaged taco seasonings. Relishes. Regular salad dressings. Where to find more information:  National Heart, Lung, and Blood Institute: www.nhlbi.nih.gov  American Heart Association: www.heart.org Summary  The DASH eating plan is a healthy eating plan that has been shown to reduce high blood pressure (hypertension). It may also reduce your risk for type 2 diabetes, heart disease, and stroke.  With the DASH eating plan, you should limit salt (sodium) intake to 2,300 mg a day. If you have hypertension, you may need to reduce your sodium intake to 1,500 mg a day.  When on the DASH eating plan, aim to eat more fresh fruits and vegetables, whole grains, lean proteins, low-fat dairy, and heart-healthy fats.  Work with your health care provider or diet and nutrition specialist (dietitian) to adjust your eating plan to your   individual calorie needs. This information is not intended to replace advice given to you by your health care provider. Make sure you discuss any questions you have with your health care provider. Document Revised: 02/10/2017 Document Reviewed: 02/22/2016 Elsevier Patient Education  2020 Elsevier Inc.  

## 2019-09-02 NOTE — Progress Notes (Signed)
Patient ID: Heidi Clark, female   DOB: [birth year], 29 y.o.   MRN: [id number] History of Present Illness: Heidi Clark is a 29 year old white female,single, G0P0, in for a well woman gyn exam and pap.She ran out of lo loestrin and got alaycen 1/35 from PCP. PCP is Dr [name].   Current Medications, Allergies, Past Medical History, Past Surgical History, Family History and Social History were reviewed in [employer] record.     Review of Systems:  Patient denies any headaches, hearing loss, fatigue, blurred vision, shortness of breath, chest pain, abdominal pain, problems with bowel movements, urination, or intercourse(has never had sex). No joint pain or mood swings. Periods are good she says.   Physical Exam:BP (!) 155/96 (BP Location: Right Arm, Cuff Size: Large)   Pulse (!) 111   Ht 5\' 1"  (1.549 m)   Wt (!) 300 lb 12.8 oz (136.4 kg)   LMP 08/26/2019   BMI 56.84 kg/m  General:  Well developed, well nourished, no acute distress Skin:  Warm and dry Neck:  Midline trachea, normal thyroid, good ROM, no lymphadenopathy Lungs; Clear to auscultation bilaterally Breast:  No dominant palpable mass, retraction, or nipple discharge Cardiovascular: Regular rate and rhythm Abdomen:  Soft, non tender, no hepatosplenomegaly,obese Pelvic:  External genitalia is normal in appearance, no lesions.  The vagina is normal in appearance. Urethra has no lesions or masses. The cervix is poorly seen, pap with blind sweep with high risk HPV 16/18 genotyping performed.08/28/2019  Uterus is felt to be normal size, shape, and contour.  No adnexal masses or tenderness noted.Bladder is non tender, no masses felt. Extremities/musculoskeletal:  No swelling or varicosities noted, no clubbing or cyanosis Psych:  No mood changes, alert and cooperative,seems happy AA 1 Fall is low PHQ 9 score is 0. Examination chaperoned by Marland Kitchen LPN  Impression and Plan: 1. Encounter for gynecological  examination with Papanicolaou smear of cervix Pap sent Physical in 1 year  -pap in 3 if normal Labs with PCP  2. Encounter for menstrual regulation Finish current OCs and start lo loestrin Meds ordered this encounter  Medications  . Norethindrone-Ethinyl Estradiol-Fe Biphas (LO LOESTRIN FE) 1 MG-10 MCG / 10 MCG tablet    Sig: Take 1 tablet by mouth daily.    Dispense:  28 tablet    Refill:  12    Order Specific Question:   Supervising Provider    Answer:   Faith Rogue, LUTHER H [[id number]]    3. Polycystic ovarian syndrome   4. Morbid obesity (HCC) Try to walk more No sodas   5. Elevated BP without diagnosis of hypertension Decrease salt and sugar Review DASH diet Follow up with me 4 weeks for BP check

## 2019-09-05 LAB — CYTOLOGY - PAP
Comment: NEGATIVE
Diagnosis: REACTIVE
High risk HPV: NEGATIVE

## 2019-09-06 ENCOUNTER — Telehealth: Payer: Self-pay

## 2019-09-06 NOTE — Telephone Encounter (Signed)
Pt's mother called to report that pt still has a deep cough and it is the same as it was a week ago. Pt's mother stated that pt is still taking the cough syrup and zpak with no relief. Please advise.

## 2019-09-06 NOTE — Telephone Encounter (Signed)
Recommend CXR and start symbicort 160/4.5 2 puff inh bid

## 2019-09-09 ENCOUNTER — Other Ambulatory Visit: Payer: Self-pay

## 2019-09-09 ENCOUNTER — Ambulatory Visit
Admission: RE | Admit: 2019-09-09 | Discharge: 2019-09-09 | Disposition: A | Discharge status: Home / Self Care | Payer: Medicaid Other | Source: Ambulatory Visit | Attending: Family Medicine | Admitting: Family Medicine

## 2019-09-09 DIAGNOSIS — J209 Acute bronchitis, unspecified: Secondary | ICD-10-CM

## 2019-09-09 DIAGNOSIS — R0602 Shortness of breath: Secondary | ICD-10-CM | POA: Diagnosis not present

## 2019-09-09 DIAGNOSIS — R05 Cough: Secondary | ICD-10-CM | POA: Diagnosis not present

## 2019-09-09 MED ORDER — BUDESONIDE-FORMOTEROL FUMARATE 160-4.5 MCG/ACT IN AERO
2.0000 | INHALATION_SPRAY | Freq: Two times a day (BID) | RESPIRATORY_TRACT | 3 refills | Status: DC
Start: 2019-09-09 — End: 2020-04-23

## 2019-09-09 NOTE — Telephone Encounter (Signed)
Rx sent to pharmacy and CXR ordered.

## 2019-09-30 ENCOUNTER — Ambulatory Visit (INDEPENDENT_AMBULATORY_CARE_PROVIDER_SITE_OTHER): Payer: Medicaid Other | Admitting: Adult Health

## 2019-09-30 ENCOUNTER — Encounter: Payer: Self-pay | Admitting: Adult Health

## 2019-09-30 VITALS — BP 137/91 | HR 108 | Ht 61.0 in | Wt 293.4 lb

## 2019-09-30 DIAGNOSIS — R030 Elevated blood-pressure reading, without diagnosis of hypertension: Secondary | ICD-10-CM | POA: Diagnosis not present

## 2019-09-30 NOTE — Progress Notes (Signed)
  Subjective:     Patient ID: Heidi Clark, female   DOB: 1990/08/20, 29 y.o.   MRN: [id number]  HPI Heidi Clark is a 29 year old white female,single, G0P0 in for a BP and weight check PCP is Dr [name].  Review of Systems No headaches Reviewed past medical,surgical, social and family history. Reviewed medications and allergies.     Objective:   Physical Exam BP (!) 137/91 (BP Location: Left Arm, Patient Position: Sitting, Cuff Size: Normal)   Pulse (!) 108   Ht 5\' 1"  (1.549 m)   Wt 293 lb 6.4 oz (133.1 kg)   BMI 55.44 kg/m  Skin warm and dry.  Lungs: clear to ausculation bilaterally. Cardiovascular: regular rate and rhythm. Has lost 6.5 lbs     Assessment:     1. Elevated BP without diagnosis of hypertension Will recheck in 8 weeks Keep walking and watch salt and sugar  2. Morbid obesity (HCC) Keep working on weight    Plan:     Follow up in 8 weeks for BP check

## 2019-11-25 ENCOUNTER — Encounter: Payer: Self-pay | Admitting: Adult Health

## 2019-11-25 ENCOUNTER — Ambulatory Visit (INDEPENDENT_AMBULATORY_CARE_PROVIDER_SITE_OTHER): Payer: Medicaid Other | Admitting: Adult Health

## 2019-11-25 VITALS — BP 137/94 | HR 108 | Ht 61.0 in | Wt 290.8 lb

## 2019-11-25 DIAGNOSIS — I10 Essential (primary) hypertension: Secondary | ICD-10-CM | POA: Diagnosis not present

## 2019-11-25 MED ORDER — LISINOPRIL 10 MG PO TABS: 10.0000 mg | ORAL_TABLET | Freq: Every day | ORAL | 3 refills | Status: DC

## 2019-11-25 NOTE — Progress Notes (Signed)
  Subjective:     Patient ID: Heidi Clark, female   DOB: 27-Sep-1990, 27 y.o.   MRN: [id number]  HPI Heidi Clark is a 29 year old white female,single, G0P0 in for a bP check, she had lost 3 lbs. PCP is Dr [name]  Review of Systems Patient denies any headaches Has never had sex, on OCs for period management Reviewed past medical,surgical, social and family history. Reviewed medications and allergies.     Objective:   Physical Exam BP (!) 137/94 (BP Location: Left Wrist, Patient Position: Sitting, Cuff Size: Normal)   Pulse (!) 108   Ht 5\' 1"  (1.549 m)   Wt 290 lb 12.8 oz (131.9 kg)   LMP 11/18/2019 (Exact Date)   BMI 54.95 kg/m  Skin warm and dry. Lungs: clear to ausculation bilaterally. Cardiovascular: regular rate and rhythm.  Upstream - 11/25/19 0926      Pregnancy Intention Screening   Does the patient want to become pregnant in the next year? No    Does the patient's partner want to become pregnant in the next year? N/A    Would the patient like to discuss contraceptive options today? No      Contraception Wrap Up   Current Method Oral Contraceptive    End Method Oral Contraceptive    Contraception Counseling Provided No             Assessment:     1. Hypertension, unspecified type Continue to watch salt and carbs Walk  Will try lisinopril now, to see if can help BP get to goal 120/80  Meds ordered this encounter  Medications  . lisinopril (ZESTRIL) 10 MG tablet    Sig: Take 1 tablet (10 mg total) by mouth daily.    Dispense:  30 tablet    Refill:  3    Order Specific Question:   Supervising Provider    Answer:   11/27/19, LUTHER H [[id number]]    2. Morbid obesity (HCC) Keep working on weight    Plan:     Follow up in 3 months for weight and BP check

## 2020-02-12 ENCOUNTER — Other Ambulatory Visit: Payer: Self-pay | Admitting: Adult Health

## 2020-02-24 ENCOUNTER — Ambulatory Visit: Payer: Medicaid Other | Admitting: Adult Health

## 2020-02-27 ENCOUNTER — Ambulatory Visit (INDEPENDENT_AMBULATORY_CARE_PROVIDER_SITE_OTHER): Payer: Medicaid Other | Admitting: Nurse Practitioner

## 2020-02-27 ENCOUNTER — Encounter: Payer: Self-pay | Admitting: Nurse Practitioner

## 2020-02-27 ENCOUNTER — Other Ambulatory Visit: Payer: Self-pay

## 2020-02-27 VITALS — BP 132/62 | HR 113 | Temp 98.4°F | Ht 61.0 in | Wt 290.0 lb

## 2020-02-27 DIAGNOSIS — R21 Rash and other nonspecific skin eruption: Secondary | ICD-10-CM

## 2020-02-27 DIAGNOSIS — R053 Chronic cough: Secondary | ICD-10-CM

## 2020-02-27 HISTORY — DX: Rash and other nonspecific skin eruption: R21

## 2020-02-27 MED ORDER — FLUTICASONE PROPIONATE 50 MCG/ACT NA SUSP: 2.0000 | Freq: Every day | NASAL | 6 refills | Status: DC

## 2020-02-27 MED ORDER — BENZONATATE 100 MG PO CAPS: 100.0000 mg | ORAL_CAPSULE | Freq: Two times a day (BID) | ORAL | 0 refills | Status: DC | PRN

## 2020-02-27 MED ORDER — CETIRIZINE HCL 10 MG PO TABS: 10.0000 mg | ORAL_TABLET | Freq: Every day | ORAL | 3 refills | Status: DC

## 2020-02-27 NOTE — Progress Notes (Signed)
Subjective:    Patient ID: Heidi Clark, female    DOB: December 03, 1990, 29 y.o.   MRN: [id number]  HPI: Heidi Clark is a 29 y.o. female presenting for  Chief Complaint  Patient presents with   Cough    Has been coughing for a few months now. Cough comes and goes. Using otc with no resolve. Denies nay other sx   COUGH Has had a cough for about 1 month.  At the beginning of this year, was started on Symbicort and this helped at first but the cough has returned.  In the past 3 months, started taking lisinopril for blood pressure.  Thinks the cough returned prior to this, however.  Duration: months Circumstances of initial development of cough: unknown Cough severity: severe Cough description: dry, barky Aggravating factors: worse in the morning; activity Alleviating factors: nothing Status: same Treatments attempted: inhaler, over the counter cough medicine Wheezing: yes; at night time when sleeping Shortness of breath: no Chest pain: no Chest tightness:no Nasal congestion: no Runny nose: no Postnasal drip: no Frequent throat clearing or swallowing: yes Hemoptysis: no Fevers: no Night sweats: no Weight loss: no Heartburn: no Recent foreign travel: no Tuberculosis contacts: no  Allergies  Allergen Reactions   No Known Allergies     Outpatient Encounter Medications as of 02/27/2020  Medication Sig   budesonide-formoterol (SYMBICORT) 160-4.5 MCG/ACT inhaler Inhale 2 puffs into the lungs 2 (two) times daily.   Cholecalciferol 5000 units capsule Take 1 capsule (5,000 Units total) by mouth daily.   lisinopril (ZESTRIL) 10 MG tablet TAKE 1 TABLET BY MOUTH EVERY DAY   Norethindrone-Ethinyl Estradiol-Fe Biphas (LO LOESTRIN FE) 1 MG-10 MCG / 10 MCG tablet Take 1 tablet by mouth daily.   [DISCONTINUED] HYDROcodone-homatropine (HYCODAN) 5-1.5 MG/5ML syrup Take 5 mLs by mouth every 8 (eight) hours as needed for cough.   benzonatate (TESSALON) 100 MG capsule Take 1  capsule (100 mg total) by mouth 2 (two) times daily as needed for cough. Take first dose at night and monitor for drowsiness.  If this medication makes you drowsy, do not take while driving or operating heavy machinery.   cetirizine (ZYRTEC) 10 MG tablet Take 1 tablet (10 mg total) by mouth daily.   fluticasone (FLONASE) 50 MCG/ACT nasal spray Place 2 sprays into both nostrils daily.   No facility-administered encounter medications on file as of 02/27/2020.    Patient Active Problem List   Diagnosis Date Noted   Chronic cough 02/27/2020   Rash and nonspecific skin eruption 02/27/2020   Hypertension 11/25/2019   Encounter for gynecological examination with Papanicolaou smear of cervix 09/02/2019   Elevated BP without diagnosis of hypertension 09/02/2019   Encounter for menstrual regulation 11/30/2017   Morbid obesity (HCC) 11/30/2017   Well woman exam with routine gynecological exam 09/25/2015   Polycystic ovarian syndrome 08/21/2014   ADHD (attention deficit hyperactivity disorder)    Insulin resistance    Legg-Calve-Perthes disease    Class 3 obesity due to excess calories without serious comorbidity with body mass index (BMI) of 50.0 to 59.9 in adult    GERD (gastroesophageal reflux disease) 10/13/2010   Obesity 10/13/2010    Past Medical History:  Diagnosis Date   ADHD (attention deficit hyperactivity disorder)    ADHD (attention deficit hyperactivity disorder)    Encounter for menstrual regulation 09/25/2015   GERD (gastroesophageal reflux disease)    Headache(784.0)    Insulin resistance    Obesity    Polycystic ovarian syndrome  Relevant past medical, surgical, family and social history reviewed and updated as indicated. Interim medical history since our last visit reviewed.  Review of Systems  Constitutional: Negative.  Negative for activity change, appetite change, chills, diaphoresis, fatigue and fever.  HENT: Negative for congestion, ear  discharge, ear pain, postnasal drip, rhinorrhea, sinus pressure, sinus pain, sneezing and sore throat.   Eyes: Negative.  Negative for pain, discharge, redness and itching.  Respiratory: Positive for cough and wheezing (at night per father). Negative for chest tightness and shortness of breath.   Cardiovascular: Negative.  Negative for chest pain.  Gastrointestinal: Negative.   Skin: Positive for rash.  Neurological: Negative.   Psychiatric/Behavioral: Negative.     Per HPI unless specifically indicated above     Objective:    BP 132/62    Pulse (!) 113    Temp 98.4 F (36.9 C)    Ht 5\' 1"  (1.549 m)    Wt 290 lb (131.5 kg)    SpO2 92%    BMI 54.80 kg/m   Wt Readings from Last 3 Encounters:  02/27/20 290 lb (131.5 kg)  11/25/19 290 lb 12.8 oz (131.9 kg)  09/30/19 293 lb 6.4 oz (133.1 kg)    Physical Exam Vitals and nursing note reviewed.  Constitutional:      Appearance: Normal appearance. She is obese.  HENT:     Head: Normocephalic and atraumatic.     Right Ear: Tympanic membrane, ear canal and external ear normal.     Left Ear: Ear canal and external ear normal. There is impacted cerumen.     Nose: Nose normal. No congestion or rhinorrhea.     Mouth/Throat:     Mouth: Mucous membranes are moist.     Pharynx: No oropharyngeal exudate or posterior oropharyngeal erythema.  Eyes:     General: No scleral icterus.       Right eye: No discharge.        Left eye: No discharge.     Extraocular Movements: Extraocular movements intact.     Pupils: Pupils are equal, round, and reactive to light.  Cardiovascular:     Rate and Rhythm: Normal rate and regular rhythm.     Heart sounds: Normal heart sounds. No murmur heard.   Pulmonary:     Effort: Pulmonary effort is normal. No respiratory distress.     Breath sounds: Normal breath sounds. No wheezing, rhonchi or rales.  Abdominal:     General: Abdomen is flat. Bowel sounds are normal. There is no distension.     Palpations:  Abdomen is soft.  Musculoskeletal:     Cervical back: Normal range of motion.  Lymphadenopathy:     Cervical: No cervical adenopathy.  Skin:    General: Skin is warm and dry.     Capillary Refill: Capillary refill takes less than 2 seconds.     Coloration: Skin is not jaundiced or pale.     Findings: Rash (right upper extremity; is pruritic and scaly in nature) present. No erythema.  Neurological:     Mental Status: She is alert and oriented to person, place, and time.     Motor: No weakness.     Gait: Gait normal.  Psychiatric:        Mood and Affect: Mood normal.        Behavior: Behavior normal.        Thought Content: Thought content normal.        Judgment: Judgment normal.  Assessment & Plan:   Problem List Items Addressed This Visit      Musculoskeletal and Integument   Rash and nonspecific skin eruption     Other   Chronic cough - Primary    Acute, ongoing.  Unclear etiology.  Differentials include asthma, allergic rhinitis-related bronchospasm, walking pneumonia, and medication side effect-related given recently started on Ace-inhibitor.  We discussed each of these at length.  No wheezing on examination today.  Will obtain chest x-ray to rule out pneumonia.  Continue inhaler regimen for now.  Will start on daily antihistamine and corticosteroid nasal spray and monitor for benefit.  Can also start daily cough suppressant - tessalon perls sent into pharmacy.  Follow up in 2-4 weeks.      Relevant Medications   benzonatate (TESSALON) 100 MG capsule   cetirizine (ZYRTEC) 10 MG tablet   fluticasone (FLONASE) 50 MCG/ACT nasal spray   Other Relevant Orders   DG Chest 2 View       Follow up plan: Return for 2-4 weeks for cough follow up.

## 2020-02-27 NOTE — Assessment & Plan Note (Addendum)
Acute, ongoing.  Unclear etiology.  Differentials include asthma, allergic rhinitis-related bronchospasm, walking pneumonia, and medication side effect-related given recently started on Ace-inhibitor.  We discussed each of these at length.  No wheezing on examination today.  Will obtain chest x-ray to rule out pneumonia.  Continue inhaler regimen for now.  Will start on daily antihistamine and corticosteroid nasal spray and monitor for benefit.  Can also start daily cough suppressant - tessalon perls sent into pharmacy.  Follow up in 2-4 weeks.

## 2020-02-27 NOTE — Patient Instructions (Signed)
Elspeth,  We will see you in 2-4 weeks.  If you see your OB/GYN in the meantime, be sure to ask them about switching your medicine to an "Arb".  It does not have the coughing side effect.   Let us know with any questions or concerns in the meantime!  Allergic Rhinitis, Adult Allergic rhinitis is an allergic reaction that affects the mucous membrane inside the nose. It causes sneezing, a runny or stuffy nose, and the feeling of mucus going down the back of the throat (postnasal drip). Allergic rhinitis can be mild to severe. There are two types of allergic rhinitis:  Seasonal. This type is also called hay fever. It happens only during certain seasons.  Perennial. This type can happen at any time of the year. What are the causes? This condition happens when the body's defense system (immune system) responds to certain harmless substances called allergens as though they were germs.  Seasonal allergic rhinitis is triggered by pollen, which can come from grasses, trees, and weeds. Perennial allergic rhinitis may be caused by:  House dust mites.  Pet dander.  Mold spores. What are the signs or symptoms? Symptoms of this condition include:  Sneezing.  Runny or stuffy nose (nasal congestion).  Postnasal drip.  Itchy nose.  Tearing of the eyes.  Trouble sleeping.  Daytime sleepiness. How is this diagnosed? This condition may be diagnosed based on:  Your medical history.  A physical exam.  Tests to check for related conditions, such as: ? Asthma. ? Pink eye. ? Ear infection. ? Upper respiratory infection.  Tests to find out which allergens trigger your symptoms. These may include skin or blood tests. How is this treated? There is no cure for this condition, but treatment can help control symptoms. Treatment may include:  Taking medicines that block allergy symptoms, such as antihistamines. Medicine may be given as a shot, nasal spray, or pill.  Avoiding the  allergen.  Desensitization. This treatment involves getting ongoing shots until your body becomes less sensitive to the allergen. This treatment may be done if other treatments do not help.  If taking medicine and avoiding the allergen does not work, new, stronger medicines may be prescribed. Follow these instructions at home:  Find out what you are allergic to. Common allergens include smoke, dust, and pollen.  Avoid the things you are allergic to. These are some things you can do to help avoid allergens: ? Replace carpet with wood, tile, or vinyl flooring. Carpet can trap dander and dust. ? Do not smoke. Do not allow smoking in your home. ? Change your heating and air conditioning filter at least once a month. ? During allergy season:  Keep windows closed as much as possible.  Plan outdoor activities when pollen counts are lowest. This is usually during the evening hours.  When coming indoors, change clothing and shower before sitting on furniture or bedding.  Take over-the-counter and prescription medicines only as told by your health care provider.  Keep all follow-up visits as told by your health care provider. This is important. Contact a health care provider if:  You have a fever.  You develop a persistent cough.  You make whistling sounds when you breathe (you wheeze).  Your symptoms interfere with your normal daily activities. Get help right away if:  You have shortness of breath. Summary  This condition can be managed by taking medicines as directed and avoiding allergens.  Contact your health care provider if you develop a persistent cough  or fever.  During allergy season, keep windows closed as much as possible. This information is not intended to replace advice given to you by your health care provider. Make sure you discuss any questions you have with your health care provider. Document Revised: 02/10/2017 Document Reviewed: 04/07/2016 Elsevier Patient  Education  2020 [employer].

## 2020-03-09 ENCOUNTER — Other Ambulatory Visit: Payer: Self-pay

## 2020-03-09 ENCOUNTER — Ambulatory Visit
Admission: RE | Admit: 2020-03-09 | Discharge: 2020-03-09 | Disposition: A | Discharge status: Home / Self Care | Payer: Medicaid Other | Source: Ambulatory Visit | Attending: Nurse Practitioner | Admitting: Nurse Practitioner

## 2020-03-09 DIAGNOSIS — R053 Chronic cough: Secondary | ICD-10-CM

## 2020-03-12 ENCOUNTER — Other Ambulatory Visit: Payer: Self-pay

## 2020-03-12 ENCOUNTER — Ambulatory Visit: Payer: Medicaid Other | Admitting: Adult Health

## 2020-03-12 ENCOUNTER — Encounter: Payer: Self-pay | Admitting: Adult Health

## 2020-03-12 VITALS — BP 127/83 | HR 109 | Ht 61.0 in | Wt 287.0 lb

## 2020-03-12 DIAGNOSIS — I10 Essential (primary) hypertension: Secondary | ICD-10-CM

## 2020-03-12 MED ORDER — AMLODIPINE BESYLATE 5 MG PO TABS: 5.0000 mg | ORAL_TABLET | Freq: Every day | ORAL | 6 refills | Status: DC

## 2020-03-12 NOTE — Progress Notes (Signed)
  Subjective:     Patient ID: Heidi Clark, female   DOB: 01-Dec-1990, 29 y.o.   MRN: [id number]  HPI Heidi Clark is a 29 year old white female, single, G0P0 back in follow up on BP and it is better but has chronic cough now which may be from lisinopril, will stop it today. PCP is Dr [name].  Review of Systems +chronic cough  Reviewed past medical,surgical, social and family history. Reviewed medications and allergies.     Objective:   Physical Exam BP 127/83 (BP Location: Left Arm, Patient Position: Sitting, Cuff Size: Large)   Pulse (!) 109   Ht 5\' 1"  (1.549 m)   Wt 287 lb (130.2 kg)   LMP 03/05/2020   BMI 54.23 kg/m  Skin warm and dry.  Lungs: clear to ausculation bilaterally. Cardiovascular: regular rate and rhythm. Has lost 3 more lbs Fall risk is low  Upstream - 03/12/20 1103      Pregnancy Intention Screening   Does the patient want to become pregnant in the next year? No    Does the patient's partner want to become pregnant in the next year? No    Would the patient like to discuss contraceptive options today? No      Contraception Wrap Up   Current Method Abstinence    End Method Abstinence    Contraception Counseling Provided No             Assessment:     1. Hypertension, unspecified type Will stop lisinopril has cough Will rx norvasc Meds ordered this encounter  Medications  . amLODipine (NORVASC) 5 MG tablet    Sig: Take 1 tablet (5 mg total) by mouth daily.    Dispense:  30 tablet    Refill:  6    Order Specific Question:   Supervising Provider    Answer:   03/14/20, LUTHER H [[id number]]  Recheck in 3 months  2. Morbid obesity (HCC) -keep working on weight loss   -try to walk more  Plan:     Follow up in 3 months for BP and weight check

## 2020-03-19 ENCOUNTER — Other Ambulatory Visit: Payer: Self-pay

## 2020-03-19 ENCOUNTER — Encounter: Payer: Self-pay | Admitting: Nurse Practitioner

## 2020-03-19 ENCOUNTER — Ambulatory Visit: Payer: Medicaid Other | Admitting: Nurse Practitioner

## 2020-03-19 ENCOUNTER — Telehealth: Payer: Self-pay

## 2020-03-19 VITALS — BP 132/84 | HR 113 | Temp 98.5°F | Ht 61.0 in | Wt 289.6 lb

## 2020-03-19 DIAGNOSIS — R053 Chronic cough: Secondary | ICD-10-CM | POA: Diagnosis not present

## 2020-03-19 MED ORDER — OMEPRAZOLE 20 MG PO CPDR
20.0000 mg | DELAYED_RELEASE_CAPSULE | Freq: Every day | ORAL | 3 refills | Status: AC
Start: 2020-03-19 — End: ?

## 2020-03-19 MED ORDER — ALBUTEROL SULFATE HFA 108 (90 BASE) MCG/ACT IN AERS: 2.0000 | INHALATION_SPRAY | Freq: Four times a day (QID) | RESPIRATORY_TRACT | 0 refills | Status: DC | PRN

## 2020-03-19 MED ORDER — PREDNISONE 10 MG PO TABS: ORAL_TABLET | ORAL | 0 refills | Status: AC

## 2020-03-19 NOTE — Patient Instructions (Signed)
Asthma, Adult  Asthma is a long-term (chronic) condition in which the airways get tight and narrow. The airways are the breathing passages that lead from the nose and mouth down into the lungs. A person with asthma will have times when symptoms get worse. These are called asthma attacks. They can cause coughing, whistling sounds when you breathe (wheezing), shortness of breath, and chest pain. They can make it hard to breathe. There is no cure for asthma, but medicines and lifestyle changes can help control it. There are many things that can bring on an asthma attack or make asthma symptoms worse (triggers). Common triggers include:  Mold.  Dust.  Cigarette smoke.  Cockroaches.  Things that can cause allergy symptoms (allergens). These include animal skin flakes (dander) and pollen from trees or grass.  Things that pollute the air. These may include household cleaners, wood smoke, smog, or chemical odors.  Cold air, weather changes, and wind.  Crying or laughing hard.  Stress.  Certain medicines or drugs.  Certain foods such as dried fruit, potato chips, and grape juice.  Infections, such as a cold or the flu.  Certain medical conditions or diseases.  Exercise or tiring activities. Asthma may be treated with medicines and by staying away from the things that cause asthma attacks. Types of medicines may include:  Controller medicines. These help prevent asthma symptoms. They are usually taken every day.  Fast-acting reliever or rescue medicines. These quickly relieve asthma symptoms. They are used as needed and provide short-term relief.  Allergy medicines if your attacks are brought on by allergens.  Medicines to help control the body's defense (immune) system. Follow these instructions at home: Avoiding triggers in your home  Change your heating and air conditioning filter often.  Limit your use of fireplaces and wood stoves.  Get rid of pests (such as roaches and  mice) and their droppings.  Throw away plants if you see mold on them.  Clean your floors. Dust regularly. Use cleaning products that do not smell.  Have someone vacuum when you are not home. Use a vacuum cleaner with a HEPA filter if possible.  Replace carpet with wood, tile, or vinyl flooring. Carpet can trap animal skin flakes and dust.  Use allergy-proof pillows, mattress covers, and box spring covers.  Wash bed sheets and blankets every week in hot water. Dry them in a dryer.  Keep your bedroom free of any triggers.  Avoid pets and keep windows closed when things that cause allergy symptoms are in the air.  Use blankets that are made of polyester or cotton.  Clean bathrooms and kitchens with bleach. If possible, have someone repaint the walls in these rooms with mold-resistant paint. Keep out of the rooms that are being cleaned and painted.  Wash your hands often with soap and water. If soap and water are not available, use hand sanitizer.  Do not allow anyone to smoke in your home. General instructions  Take over-the-counter and prescription medicines only as told by your doctor. ? Talk with your doctor if you have questions about how or when to take your medicines. ? Make note if you need to use your medicines more often than usual.  Do not use any products that contain nicotine or tobacco, such as cigarettes and e-cigarettes. If you need help quitting, ask your doctor.  Stay away from secondhand smoke.  Avoid doing things outdoors when allergen counts are high and when air quality is low.  Wear a ski mask   when doing outdoor activities in the winter. The mask should cover your nose and mouth. Exercise indoors on cold days if you can.  Warm up before you exercise. Take time to cool down after exercise.  Use a peak flow meter as told by your doctor. A peak flow meter is a tool that measures how well the lungs are working.  Keep track of the peak flow meter's readings.  Write them down.  Follow your asthma action plan. This is a written plan for taking care of your asthma and treating your attacks.  Make sure you get all the shots (vaccines) that your doctor recommends. Ask your doctor about a flu shot and a pneumonia shot.  Keep all follow-up visits as told by your doctor. This is important. Contact a doctor if:  You have wheezing, shortness of breath, or a cough even while taking medicine to prevent attacks.  The mucus you cough up (sputum) is thicker than usual.  The mucus you cough up changes from clear or white to yellow, green, gray, or bloody.  You have problems from the medicine you are taking, such as: ? A rash. ? Itching. ? Swelling. ? Trouble breathing.  You need reliever medicines more than 2-3 times a week.  Your peak flow reading is still at 50-79% of your personal best after following the action plan for 1 hour.  You have a fever. Get help right away if:  You seem to be worse and are not responding to medicine during an asthma attack.  You are short of breath even at rest.  You get short of breath when doing very little activity.  You have trouble eating, drinking, or talking.  You have chest pain or tightness.  You have a fast heartbeat.  Your lips or fingernails start to turn blue.  You are light-headed or dizzy, or you faint.  Your peak flow is less than 50% of your personal best.  You feel too tired to breathe normally. Summary  Asthma is a long-term (chronic) condition in which the airways get tight and narrow. An asthma attack can make it hard to breathe.  Asthma cannot be cured, but medicines and lifestyle changes can help control it.  Make sure you understand how to avoid triggers and how and when to use your medicines. This information is not intended to replace advice given to you by your health care provider. Make sure you discuss any questions you have with your health care provider. Document Revised:  05/03/2018 Document Reviewed: 04/04/2016 Elsevier Patient Education  2020 [employer] Inc[employer]  

## 2020-03-19 NOTE — Telephone Encounter (Signed)
Spoke with pt, she is aware of the new inhaler and the omeprazole medication she is to start. She will follow up in one month

## 2020-03-19 NOTE — Progress Notes (Signed)
Subjective:    Patient ID: Heidi Clark, female    DOB: 1990/06/15, 30 y.o.   MRN: [id number]  HPI: Heidi Clark is a 30 y.o. female presenting for ongoing cough.  Chief Complaint  Patient presents with  . Cough    Meds did not seem to work with cough   COUGH Duration: months; got a little better after last visit but is bad again Circumstances of initial development of cough: unknown Cough severity: severe Cough description: dry, barky Aggravating factors: worse at night Alleviating factors: nothing Status: same Treatments attempted: inhaler, nasal corticosteroid, antihistamine, changed ACE to amlodipine,   Wheezing: yes; when she is sleeping Shortness of breath: no Chest pain: no Chest tightness:no Nasal congestion: no Runny nose: no Postnasal drip: no Frequent throat clearing or swallowing: no Hemoptysis: no Fevers: no Night sweats: no Weight loss: no Heartburn: no Recent foreign travel: no Tuberculosis contacts: no  Allergies  Allergen Reactions  . No Known Allergies     Outpatient Encounter Medications as of 03/19/2020  Medication Sig  . albuterol (VENTOLIN HFA) 108 (90 Base) MCG/ACT inhaler Inhale 2 puffs into the lungs every 6 (six) hours as needed for wheezing or shortness of breath.  Marland Kitchen amLODipine (NORVASC) 5 MG tablet Take 1 tablet (5 mg total) by mouth daily.  . budesonide-formoterol (SYMBICORT) 160-4.5 MCG/ACT inhaler Inhale 2 puffs into the lungs 2 (two) times daily.  . cetirizine (ZYRTEC) 10 MG tablet Take 1 tablet (10 mg total) by mouth daily.  . Cholecalciferol 5000 units capsule Take 1 capsule (5,000 Units total) by mouth daily.  . fluticasone (FLONASE) 50 MCG/ACT nasal spray Place 2 sprays into both nostrils daily.  . Norethindrone-Ethinyl Estradiol-Fe Biphas (LO LOESTRIN FE) 1 MG-10 MCG / 10 MCG tablet Take 1 tablet by mouth daily.  Marland Kitchen omeprazole (PRILOSEC) 20 MG capsule Take 1 capsule (20 mg total) by mouth daily.  . predniSONE  (DELTASONE) 10 MG tablet Take 3 tablets (30 mg total) by mouth daily with breakfast for 2 days, THEN 2 tablets (20 mg total) daily with breakfast for 2 days, THEN 1 tablet (10 mg total) daily with breakfast for 2 days.  . [DISCONTINUED] benzonatate (TESSALON) 100 MG capsule Take 1 capsule (100 mg total) by mouth 2 (two) times daily as needed for cough. Take first dose at night and monitor for drowsiness.  If this medication makes you drowsy, do not take while driving or operating heavy machinery.  . [DISCONTINUED] lisinopril (ZESTRIL) 10 MG tablet TAKE 1 TABLET BY MOUTH EVERY DAY   No facility-administered encounter medications on file as of 03/19/2020.    Patient Active Problem List   Diagnosis Date Noted  . Chronic cough 02/27/2020  . Rash and nonspecific skin eruption 02/27/2020  . Hypertension 11/25/2019  . Encounter for gynecological examination with Papanicolaou smear of cervix 09/02/2019  . Elevated BP without diagnosis of hypertension 09/02/2019  . Encounter for menstrual regulation 11/30/2017  . Morbid obesity (HCC) 11/30/2017  . Well woman exam with routine gynecological exam 09/25/2015  . Polycystic ovarian syndrome 08/21/2014  . ADHD (attention deficit hyperactivity disorder)   . Insulin resistance   . Legg-Calve-Perthes disease   . Class 3 obesity due to excess calories without serious comorbidity with body mass index (BMI) of 50.0 to 59.9 in adult   . GERD (gastroesophageal reflux disease) 10/13/2010  . Obesity 10/13/2010    Past Medical History:  Diagnosis Date  . ADHD (attention deficit hyperactivity disorder)   . ADHD (attention  deficit hyperactivity disorder)   . Encounter for menstrual regulation 09/25/2015  . GERD (gastroesophageal reflux disease)   . Headache(784.0)   . Insulin resistance   . Obesity   . Polycystic ovarian syndrome     Relevant past medical, surgical, family and social history reviewed and updated as indicated. Interim medical history since our  last visit reviewed.  Review of Systems  Constitutional: Negative.  Negative for activity change, appetite change, chills, diaphoresis, fatigue and fever.  HENT: Negative.  Negative for congestion, ear discharge, postnasal drip, rhinorrhea, sinus pressure, sinus pain, sneezing and sore throat.   Eyes: Negative.   Respiratory: Positive for cough and wheezing. Negative for chest tightness and shortness of breath.   Cardiovascular: Negative.  Negative for chest pain and leg swelling.  Gastrointestinal: Negative.  Negative for diarrhea, nausea and vomiting.  Skin: Negative.   Neurological: Negative.   Psychiatric/Behavioral: Negative.     Per HPI unless specifically indicated above     Objective:    BP 132/84   Pulse (!) 113   Temp 98.5 F (36.9 C)   Ht 5\' 1"  (1.549 m)   Wt 289 lb 9.6 oz (131.4 kg)   LMP 03/05/2020   SpO2 96%   BMI 54.72 kg/m   Wt Readings from Last 3 Encounters:  03/19/20 289 lb 9.6 oz (131.4 kg)  03/12/20 287 lb (130.2 kg)  02/27/20 290 lb (131.5 kg)    Physical Exam Vitals and nursing note reviewed.  Constitutional:      General: She is not in acute distress.    Appearance: Normal appearance. She is obese. She is not toxic-appearing.  HENT:     Head: Normocephalic and atraumatic.     Right Ear: External ear normal.     Left Ear: External ear normal.     Nose: Nose normal. No congestion.     Mouth/Throat:     Mouth: Mucous membranes are moist.     Pharynx: Oropharynx is clear. No oropharyngeal exudate.  Eyes:     General: No scleral icterus.    Extraocular Movements: Extraocular movements intact.  Cardiovascular:     Rate and Rhythm: Regular rhythm.     Heart sounds: Normal heart sounds. No murmur heard.   Pulmonary:     Comments: Pre bronchodilator - decreased air movement with some expiratory wheezing bilaterally throughout Post bronchodilator - much improved air movement with slightly increased inspiratory and expiratory wheezing bilaterally  throughout Skin:    General: Skin is warm.     Coloration: Skin is not jaundiced or pale.     Findings: No erythema.  Neurological:     Mental Status: She is alert and oriented to person, place, and time.  Psychiatric:        Mood and Affect: Mood normal.        Behavior: Behavior normal.        Thought Content: Thought content normal.        Judgment: Judgment normal.        Assessment & Plan:   Problem List Items Addressed This Visit      Other   Chronic cough - Primary    Acute, ongoing.  Allergy regimen did not help.  Tessalon did not help.  Switching ACE to CCB did not help.  Reports taking Symbicort twice daily as prescribed.  With wheezing on examination today, nebulizer treatment provided with great relief of symptoms and cough.  Will start SABA prn and start prednisone for ?asthma exacerbation.  Will also start PPI in case of uncontrolled GERD.  Follow up in 4 weeks.      Relevant Medications   albuterol (VENTOLIN HFA) 108 (90 Base) MCG/ACT inhaler   predniSONE (DELTASONE) 10 MG tablet   omeprazole (PRILOSEC) 20 MG capsule       Follow up plan: Return for 2-4 weeks for cough follow up.

## 2020-03-19 NOTE — Assessment & Plan Note (Signed)
Acute, ongoing.  Allergy regimen did not help.  Tessalon did not help.  Switching ACE to CCB did not help.  Reports taking Symbicort twice daily as prescribed.  With wheezing on examination today, nebulizer treatment provided with great relief of symptoms and cough.  Will start SABA prn and start prednisone for ?asthma exacerbation.  Will also start PPI in case of uncontrolled GERD.  Follow up in 4 weeks.

## 2020-03-19 NOTE — Telephone Encounter (Signed)
-----   Message from [name], NP sent at 03/19/2020  9:11 AM EST ----- Please let them know that I talked with Dr. [name].  We are not going to change inhalers, going to stay with plan we discussed and add in rescue inhaler and start oral steroids to help decrease inflammation in her lungs.  Also want her to start on omeprazole daily to help with stomach acid and see her back in 4 weeks.

## 2020-03-19 NOTE — Progress Notes (Signed)
Left vm for pt to call bk to discuss medication change

## 2020-03-21 DIAGNOSIS — H5213 Myopia, bilateral: Secondary | ICD-10-CM | POA: Diagnosis not present

## 2020-03-23 NOTE — Progress Notes (Signed)
Pt is aware of NP orders

## 2020-03-30 ENCOUNTER — Ambulatory Visit: Payer: Medicaid Other | Admitting: Family Medicine

## 2020-04-02 ENCOUNTER — Encounter: Payer: Self-pay | Admitting: Nurse Practitioner

## 2020-04-02 ENCOUNTER — Other Ambulatory Visit: Payer: Self-pay

## 2020-04-02 ENCOUNTER — Ambulatory Visit (INDEPENDENT_AMBULATORY_CARE_PROVIDER_SITE_OTHER): Payer: Medicaid Other | Admitting: Nurse Practitioner

## 2020-04-02 VITALS — BP 108/70 | HR 106 | Resp 16 | Ht 61.0 in | Wt 287.0 lb

## 2020-04-02 DIAGNOSIS — H9311 Tinnitus, right ear: Secondary | ICD-10-CM | POA: Diagnosis not present

## 2020-04-02 MED ORDER — MOMETASONE FUROATE 50 MCG/ACT NA SUSP: 2.0000 | Freq: Every day | NASAL | 12 refills | Status: DC

## 2020-04-02 NOTE — Progress Notes (Signed)
Subjective:    Patient ID: Heidi Clark, female    DOB: [birth year], 30 y.o.   MRN: [id number]  HPI: Heidi Clark is a 30 y.o. female presenting for ear ringing.  Chief Complaint  Patient presents with  . Tinnitus    Right ear only. Started last Thursday 02/24/20. No dizziness. No n/v   TINNITUS Duration: days Description of tinnitus: ringing Pulsatile: no Tinnitus duration: continuous Episode frequency: continous Severity: moderate Aggravating factors:  Alleviating factors: Head injury: no Chronic exposure to loud noises: no Exposure to ototoxic medications: no Vertigo:no Hearing loss: yes Aural fullness: yes Headache:no  TMJ syndrome symptoms: does make a popping sound when she opens mouth wide Unsteady gait: no Postural instability: no Diplopia, dysarthria, dysphagia or weakness: no Anxiety/depression: no   Allergies  Allergen Reactions  . No Known Allergies     Outpatient Encounter Medications as of 04/02/2020  Medication Sig  . albuterol (VENTOLIN HFA) 108 (90 Base) MCG/ACT inhaler Inhale 2 puffs into the lungs every 6 (six) hours as needed for wheezing or shortness of breath.  Marland Kitchen amLODipine (NORVASC) 5 MG tablet Take 1 tablet (5 mg total) by mouth daily.  . budesonide-formoterol (SYMBICORT) 160-4.5 MCG/ACT inhaler Inhale 2 puffs into the lungs 2 (two) times daily.  . cetirizine (ZYRTEC) 10 MG tablet Take 1 tablet (10 mg total) by mouth daily.  . Cholecalciferol 5000 units capsule Take 1 capsule (5,000 Units total) by mouth daily.  . mometasone (NASONEX) 50 MCG/ACT nasal spray Place 2 sprays into the nose daily.  . Norethindrone-Ethinyl Estradiol-Fe Biphas (LO LOESTRIN FE) 1 MG-10 MCG / 10 MCG tablet Take 1 tablet by mouth daily.  Marland Kitchen omeprazole (PRILOSEC) 20 MG capsule Take 1 capsule (20 mg total) by mouth daily.  . [DISCONTINUED] fluticasone (FLONASE) 50 MCG/ACT nasal spray Place 2 sprays into both nostrils daily.   No facility-administered encounter  medications on file as of 04/02/2020.    Patient Active Problem List   Diagnosis Date Noted  . Tinnitus of right ear 04/02/2020  . Chronic cough 02/27/2020  . Hypertension 11/25/2019  . Encounter for gynecological examination with Papanicolaou smear of cervix 09/02/2019  . Encounter for menstrual regulation 11/30/2017  . Morbid obesity (HCC) 11/30/2017  . Well woman exam with routine gynecological exam 09/25/2015  . Polycystic ovarian syndrome 08/21/2014  . ADHD (attention deficit hyperactivity disorder)   . Insulin resistance   . Legg-Calve-Perthes disease   . Class 3 obesity due to excess calories without serious comorbidity with body mass index (BMI) of 50.0 to 59.9 in adult   . GERD (gastroesophageal reflux disease) 10/13/2010  . Obesity 10/13/2010    Past Medical History:  Diagnosis Date  . ADHD (attention deficit hyperactivity disorder)   . ADHD (attention deficit hyperactivity disorder)   . Elevated BP without diagnosis of hypertension 09/02/2019  . Encounter for menstrual regulation 09/25/2015  . GERD (gastroesophageal reflux disease)   . Headache(784.0)   . Insulin resistance   . Obesity   . Polycystic ovarian syndrome   . Rash and nonspecific skin eruption 02/27/2020    Relevant past medical, surgical, family and social history reviewed and updated as indicated. Interim medical history since our last visit reviewed.  Review of Systems  Constitutional: Negative.  Negative for activity change, appetite change and fever.  Eyes: Negative.    See HPI for pertinent positive and negatives. Per HPI unless specifically indicated above     Objective:    BP 108/70   Pulse Marland Kitchen)  106   Resp 16   Ht 5\' 1"  (1.549 m)   Wt 287 lb (130.2 kg)   LMP 03/05/2020   SpO2 98%   BMI 54.23 kg/m   Wt Readings from Last 3 Encounters:  04/02/20 287 lb (130.2 kg)  03/19/20 289 lb 9.6 oz (131.4 kg)  03/12/20 287 lb (130.2 kg)    Physical Exam Vitals and nursing note reviewed.   Constitutional:      Appearance: Normal appearance. She is obese.  HENT:     Head: Normocephalic and atraumatic.     Right Ear: Tympanic membrane, ear canal and external ear normal.     Left Ear: Tympanic membrane, ear canal and external ear normal. There is impacted cerumen.     Nose: Nose normal. No congestion.     Mouth/Throat:     Mouth: Mucous membranes are moist.     Pharynx: Oropharynx is clear. No oropharyngeal exudate.  Eyes:     General: No scleral icterus.    Extraocular Movements: Extraocular movements intact.  Musculoskeletal:     Cervical back: Normal range of motion. No rigidity.  Lymphadenopathy:     Cervical: No cervical adenopathy.  Skin:    General: Skin is warm and dry.     Capillary Refill: Capillary refill takes less than 2 seconds.     Coloration: Skin is not jaundiced or pale.     Findings: No erythema.  Neurological:     Mental Status: She is alert and oriented to person, place, and time.  Psychiatric:        Mood and Affect: Mood normal.        Behavior: Behavior normal.        Thought Content: Thought content normal.        Judgment: Judgment normal.        Assessment & Plan:   Problem List Items Addressed This Visit      Other   Tinnitus of right ear - Primary    Acute, ongoing.  Unclear etiology; impacted cerumen noted to left ear and not right.  Unclear if calcium channel blocker may be causing, although this is not a brand new medication to the patient.  Examination today unremarkable.  Will trial alternate nasal spray like Nasonex to see if this helps with ringing sensation.  Can also try OTC Debrox otic drops to bilateral ears to help loosen cerumen, although not impacted to right ear.  With no improvement, consider alternative antihypertensive regimen vs. Referral to audiologist.        Relevant Medications   mometasone (NASONEX) 50 MCG/ACT nasal spray       Follow up plan: Return in about 3 weeks (around 04/23/2020) for chronic cough  and tinnitus f/u.

## 2020-04-02 NOTE — Patient Instructions (Signed)

## 2020-04-02 NOTE — Assessment & Plan Note (Signed)
Acute, ongoing.  Unclear etiology; impacted cerumen noted to left ear and not right.  Unclear if calcium channel blocker may be causing, although this is not a brand new medication to the patient.  Examination today unremarkable.  Will trial alternate nasal spray like Nasonex to see if this helps with ringing sensation.  Can also try OTC Debrox otic drops to bilateral ears to help loosen cerumen, although not impacted to right ear.  With no improvement, consider alternative antihypertensive regimen vs. Referral to audiologist.

## 2020-04-06 ENCOUNTER — Telehealth: Payer: Self-pay | Admitting: Family Medicine

## 2020-04-06 NOTE — Telephone Encounter (Signed)
Cover my meds prior authorization needed on mometasone (NASONEX) 50 MCG/ACT nasal spray on fax is [id number]

## 2020-04-09 NOTE — Telephone Encounter (Signed)
Call placed to patient and patient made aware.  

## 2020-04-09 NOTE — Telephone Encounter (Signed)
Patient can try OTC Nasacort.

## 2020-04-09 NOTE — Telephone Encounter (Signed)
Received request from pharmacy for PA on Nasonex.  PA submitted.   Dx: J30.2- seasonal allergic rhinitis.    Your information has been sent to [facility].    Preferred alternatives include: astepro nasal spray Azelastine nasal spray Fluticasone Ipratropium nasal spray  Olopatadine nasal spray

## 2020-04-09 NOTE — Telephone Encounter (Signed)
Received PA determination.   PA denied.   Please advise.  

## 2020-04-22 NOTE — Progress Notes (Signed)
Subjective:    Patient ID: Heidi Clark, female    DOB: 10/18/1990, 30 y.o.   MRN: [id number]  HPI: Heidi Clark is a 30 y.o. female presenting for cough and ear ringing follow up.  Chief Complaint  Patient presents with  . Follow-up    Follow up with ear and coughing. Right ear is still ringing and there is still a cough   COUGH Thinks cough is worse when she lays down to sleep.  As her last visit, we added as needed albuterol to the Symbicort and started a prednisone taper.  We also started omeprazole for questionable acid reflux.  It is unclear how frequently she is taking the albuterol.  Duration: wekes Circumstances of initial development of cough: URI Cough severity: mild Cough description: dry, barky Aggravating factors: laying down at nighttime Alleviating factors: albuterol Status: stable Treatments attempted: prednisone, SABA Wheezing: yes; just when sleeping Shortness of breath: all the time  Snoring: no Wake up feeling refreshed: yes; usually gets at least 6 hours of sleep Naps during the day: yes; many Chest pain: no Chest tightness:no Nasal congestion: no Runny nose: no Postnasal drip: no Frequent throat clearing or swallowing: yes Hemoptysis: no Fevers: no Night sweats: no Weight loss: no Heartburn: no Recent foreign travel: no Tuberculosis contacts: no  TINNITUS Did not try Debrox to left ear.  Still having high pitch ringing in right ear.  Duration: weeks Description of tinnitus: ringing; high pitch Pulsatile: yes Tinnitus duration: continuous Episode frequency: continuous Severity: moderate Aggravating factors:  Alleviating factors: Head injury: no Chronic exposure to loud noises: no Exposure to ototoxic medications: no Vertigo:no Hearing loss: no Aural fullness: no Headache:no  TMJ syndrome symptoms: no Unsteady gait: no Postural instability: no Diplopia, dysarthria, dysphagia or weakness: no Anxiety/depression: no    Allergies  Allergen Reactions  . No Known Allergies     Outpatient Encounter Medications as of 04/23/2020  Medication Sig  . albuterol (VENTOLIN HFA) 108 (90 Base) MCG/ACT inhaler Inhale 2 puffs into the lungs every 6 (six) hours as needed for wheezing or shortness of breath.  Marland Kitchen amLODipine (NORVASC) 5 MG tablet Take 1 tablet (5 mg total) by mouth daily.  . cetirizine (ZYRTEC) 10 MG tablet Take 1 tablet (10 mg total) by mouth daily.  . Cholecalciferol 5000 units capsule Take 1 capsule (5,000 Units total) by mouth daily.  . Norethindrone-Ethinyl Estradiol-Fe Biphas (LO LOESTRIN FE) 1 MG-10 MCG / 10 MCG tablet Take 1 tablet by mouth daily.  Marland Kitchen omeprazole (PRILOSEC) 20 MG capsule Take 1 capsule (20 mg total) by mouth daily.  . [DISCONTINUED] budesonide-formoterol (SYMBICORT) 160-4.5 MCG/ACT inhaler Inhale 2 puffs into the lungs 2 (two) times daily.  . [DISCONTINUED] mometasone (NASONEX) 50 MCG/ACT nasal spray Place 2 sprays into the nose daily.  . budesonide-formoterol (SYMBICORT) 160-4.5 MCG/ACT inhaler Inhale 2 puffs into the lungs 2 (two) times daily.  . fluticasone (FLONASE) 50 MCG/ACT nasal spray Place 2 sprays into both nostrils daily.   No facility-administered encounter medications on file as of 04/23/2020.    Patient Active Problem List   Diagnosis Date Noted  . Tinnitus of right ear 04/02/2020  . Chronic cough 02/27/2020  . Hypertension 11/25/2019  . Encounter for gynecological examination with Papanicolaou smear of cervix 09/02/2019  . Encounter for menstrual regulation 11/30/2017  . Morbid obesity (HCC) 11/30/2017  . Well woman exam with routine gynecological exam 09/25/2015  . Polycystic ovarian syndrome 08/21/2014  . ADHD (attention deficit hyperactivity disorder)   .  Insulin resistance   . Legg-Calve-Perthes disease   . Class 3 obesity due to excess calories without serious comorbidity with body mass index (BMI) of 50.0 to 59.9 in adult   . GERD (gastroesophageal reflux  disease) 10/13/2010  . Obesity 10/13/2010    Past Medical History:  Diagnosis Date  . ADHD (attention deficit hyperactivity disorder)   . ADHD (attention deficit hyperactivity disorder)   . Elevated BP without diagnosis of hypertension 09/02/2019  . Encounter for menstrual regulation 09/25/2015  . GERD (gastroesophageal reflux disease)   . Headache(784.0)   . Insulin resistance   . Obesity   . Polycystic ovarian syndrome   . Rash and nonspecific skin eruption 02/27/2020   Relevant past medical, surgical, family and social history reviewed and updated as indicated. Interim medical history since our last visit reviewed.  Review of Systems Per HPI unless specifically indicated above     Objective:    BP 128/80   Pulse (!) 115   Temp 97.9 F (36.6 C)   Ht 5\' 1"  (1.549 m)   Wt 289 lb (131.1 kg)   SpO2 96%   BMI 54.61 kg/m   Wt Readings from Last 3 Encounters:  04/23/20 289 lb (131.1 kg)  04/02/20 287 lb (130.2 kg)  03/19/20 289 lb 9.6 oz (131.4 kg)    Physical Exam Vitals and nursing note reviewed.  Constitutional:      General: She is not in acute distress.    Appearance: She is obese. She is not toxic-appearing.  HENT:     Head: Normocephalic and atraumatic.     Right Ear: Tympanic membrane, ear canal and external ear normal. There is no impacted cerumen.     Left Ear: Tympanic membrane and ear canal normal. There is impacted cerumen.     Nose: Nose normal. No congestion or rhinorrhea.     Mouth/Throat:     Mouth: Mucous membranes are moist.     Pharynx: Oropharynx is clear. No oropharyngeal exudate or posterior oropharyngeal erythema.  Eyes:     General: No scleral icterus.    Extraocular Movements: Extraocular movements intact.  Cardiovascular:     Rate and Rhythm: Tachycardia present.     Heart sounds: Normal heart sounds. No murmur heard.   Pulmonary:     Effort: Pulmonary effort is normal. No respiratory distress.     Breath sounds: No stridor. Wheezing  present. No rhonchi or rales.  Musculoskeletal:        General: Normal range of motion.     Cervical back: Normal range of motion.     Right lower leg: No edema.     Left lower leg: No edema.  Lymphadenopathy:     Cervical: No cervical adenopathy.  Skin:    General: Skin is warm and dry.     Capillary Refill: Capillary refill takes less than 2 seconds.     Coloration: Skin is not jaundiced or pale.     Findings: No erythema.  Neurological:     Mental Status: She is alert and oriented to person, place, and time.     Motor: No weakness.     Gait: Gait normal.  Psychiatric:        Mood and Affect: Mood normal.        Behavior: Behavior normal.        Thought Content: Thought content normal.        Judgment: Judgment normal.       Assessment & Plan:   Problem  List Items Addressed This Visit      Other   Chronic cough    Chronic, ongoing.  No coughing today on examination which is much improved from previous; although patient reports worse symptoms at night time.  Question sleep apnea given body habitus. Will place referral to pulmonary for ongoing treatment of cough and further investigation regarding sleep study.  Continue Symbicort/albuterol as needed and PPI.      Relevant Medications   budesonide-formoterol (SYMBICORT) 160-4.5 MCG/ACT inhaler   Other Relevant Orders   Ambulatory referral to Pulmonology   Morbid obesity (HCC)   Tinnitus of right ear    Acute, ongoing.  As patient did not try debrox, we irrigated left ear with solution of water and hydrogen peroxide.  Moderate amounts of cerumen were removed and patient tolerated this well.  She reported complete resolution of the tinnitus in the right ear after the irrigation of the left ear was complete.  Encouraged patient to use over the head headphones instead of ear buds in future to prevent wax build-up.  No Q-tips.      Relevant Orders   Ear Lavage    Other Visit Diagnoses    Wheezing    -  Primary   Relevant  Medications   budesonide-formoterol (SYMBICORT) 160-4.5 MCG/ACT inhaler   Other Relevant Orders   Ambulatory referral to Pulmonology       Follow up plan: Return if symptoms worsen or fail to improve.

## 2020-04-23 ENCOUNTER — Ambulatory Visit: Payer: Medicaid Other | Admitting: Nurse Practitioner

## 2020-04-23 ENCOUNTER — Encounter: Payer: Self-pay | Admitting: Nurse Practitioner

## 2020-04-23 ENCOUNTER — Other Ambulatory Visit: Payer: Self-pay

## 2020-04-23 VITALS — BP 128/80 | HR 115 | Temp 97.9°F | Ht 61.0 in | Wt 289.0 lb

## 2020-04-23 DIAGNOSIS — R062 Wheezing: Secondary | ICD-10-CM

## 2020-04-23 DIAGNOSIS — H9311 Tinnitus, right ear: Secondary | ICD-10-CM

## 2020-04-23 DIAGNOSIS — R053 Chronic cough: Secondary | ICD-10-CM

## 2020-04-23 MED ORDER — BUDESONIDE-FORMOTEROL FUMARATE 160-4.5 MCG/ACT IN AERO: 2.0000 | INHALATION_SPRAY | Freq: Two times a day (BID) | RESPIRATORY_TRACT | 3 refills | Status: DC

## 2020-04-23 NOTE — Assessment & Plan Note (Signed)
Chronic, ongoing.  No coughing today on examination which is much improved from previous; although patient reports worse symptoms at night time.  Question sleep apnea given body habitus. Will place referral to pulmonary for ongoing treatment of cough and further investigation regarding sleep study.  Continue Symbicort/albuterol as needed and PPI.

## 2020-04-23 NOTE — Assessment & Plan Note (Signed)
Acute, ongoing.  As patient did not try debrox, we irrigated left ear with solution of water and hydrogen peroxide.  Moderate amounts of cerumen were removed and patient tolerated this well.  She reported complete resolution of the tinnitus in the right ear after the irrigation of the left ear was complete.  Encouraged patient to use over the head headphones instead of ear buds in future to prevent wax build-up.  No Q-tips.

## 2020-06-11 ENCOUNTER — Ambulatory Visit: Payer: Medicaid Other | Admitting: Adult Health

## 2020-07-08 ENCOUNTER — Telehealth: Payer: Self-pay | Admitting: Adult Health

## 2020-07-08 MED ORDER — LO LOESTRIN FE 1 MG-10 MCG / 10 MCG PO TABS: 1.0000 | ORAL_TABLET | Freq: Every day | ORAL | 3 refills | Status: DC

## 2020-07-08 NOTE — Telephone Encounter (Signed)
Refilled Lo Loestrin till physical appt in June

## 2020-07-08 NOTE — Addendum Note (Signed)
Addended by: Cyril Mourning A on: 07/08/2020 03:32 PM   Modules accepted: Orders

## 2020-07-08 NOTE — Telephone Encounter (Signed)
Patient's mom called stating that her daughter need a refill of her Birth control medication until her pap/physical appointment on 09/02/2020. Please contact pt's mom when sent.

## 2020-08-07 IMAGING — CR DG CHEST 2V
3 series · 3 of 3 positions shown · non-contrast
Comparison: None.

CLINICAL DATA: Cough for 3 weeks

EXAM:
CHEST - 2 VIEW

[w chest pa]
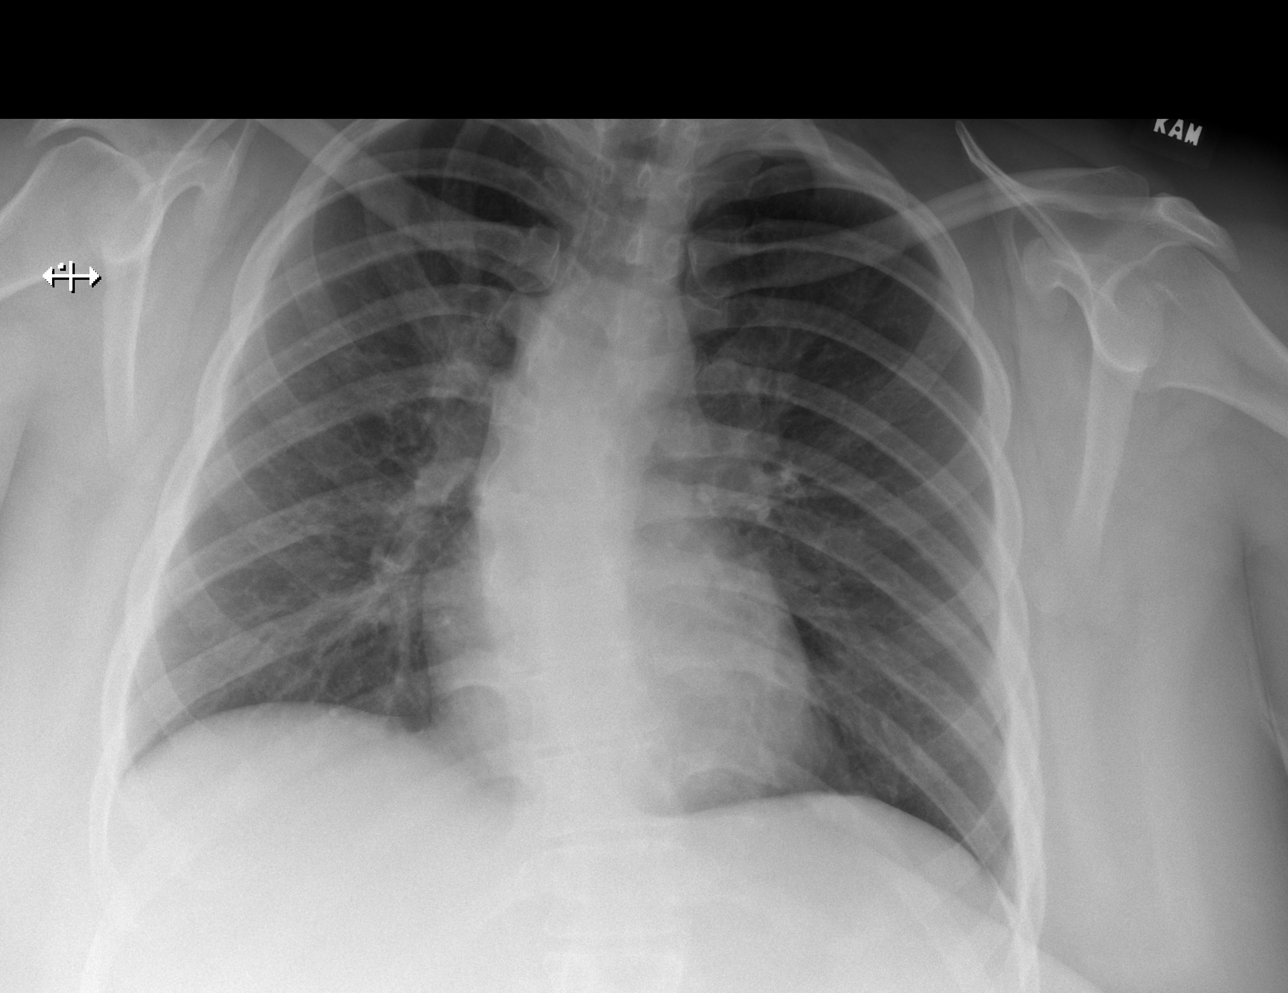

[w chest lat (1 of 2)]
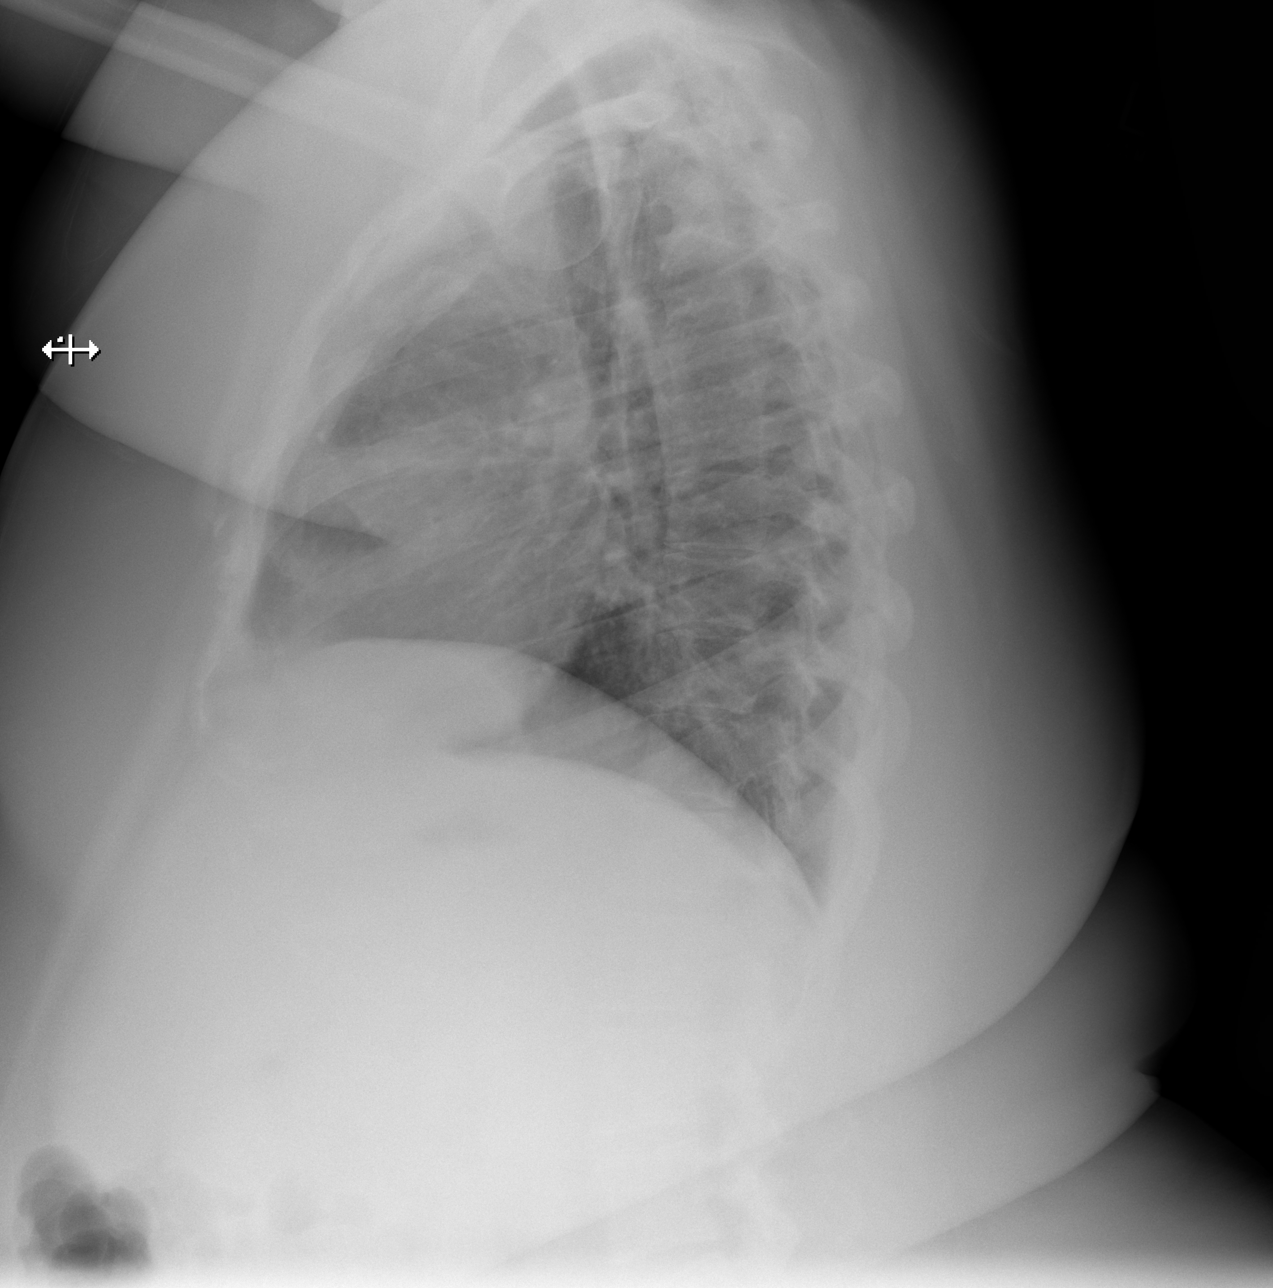

[w chest lat (2 of 2)]
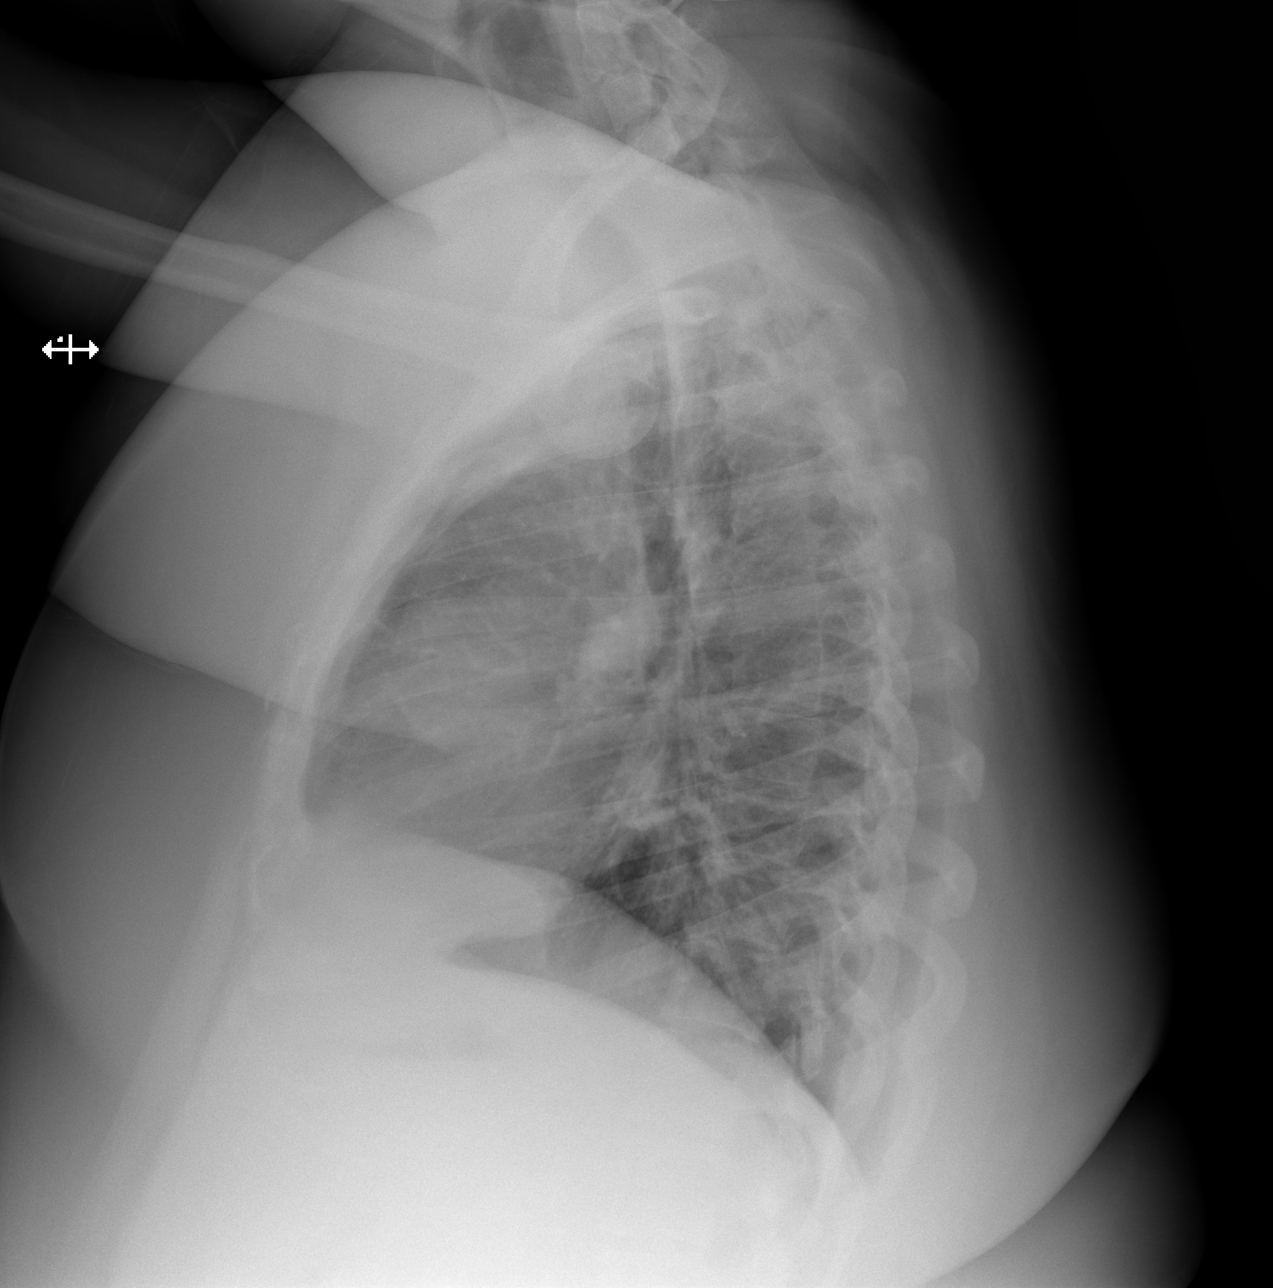

[3 of 3 positions shown; findings below may reference images not displayed]

FINDINGS: The heart size and mediastinal contours are within normal limits.
Both lungs are clear. The visualized skeletal structures are
unremarkable.
IMPRESSION: No active cardiopulmonary disease.

## 2020-08-11 ENCOUNTER — Telehealth: Payer: Self-pay | Admitting: *Deleted

## 2020-09-02 ENCOUNTER — Other Ambulatory Visit: Payer: Medicaid Other | Admitting: Adult Health

## 2020-10-06 ENCOUNTER — Other Ambulatory Visit: Payer: Medicaid Other | Admitting: Adult Health

## 2020-10-20 ENCOUNTER — Ambulatory Visit (INDEPENDENT_AMBULATORY_CARE_PROVIDER_SITE_OTHER): Payer: Medicaid Other | Admitting: Adult Health

## 2020-10-20 ENCOUNTER — Other Ambulatory Visit: Payer: Self-pay

## 2020-10-20 ENCOUNTER — Encounter: Payer: Self-pay | Admitting: Adult Health

## 2020-10-20 VITALS — BP 149/90 | HR 130 | Ht 60.0 in | Wt 282.5 lb

## 2020-10-20 DIAGNOSIS — Z7689 Persons encountering health services in other specified circumstances: Secondary | ICD-10-CM

## 2020-10-20 DIAGNOSIS — I10 Essential (primary) hypertension: Secondary | ICD-10-CM | POA: Diagnosis not present

## 2020-10-20 DIAGNOSIS — E282 Polycystic ovarian syndrome: Secondary | ICD-10-CM | POA: Diagnosis not present

## 2020-10-20 DIAGNOSIS — Z01419 Encounter for gynecological examination (general) (routine) without abnormal findings: Secondary | ICD-10-CM

## 2020-10-20 DIAGNOSIS — R7309 Other abnormal glucose: Secondary | ICD-10-CM | POA: Diagnosis not present

## 2020-10-20 DIAGNOSIS — D649 Anemia, unspecified: Secondary | ICD-10-CM | POA: Diagnosis not present

## 2020-10-20 DIAGNOSIS — R7989 Other specified abnormal findings of blood chemistry: Secondary | ICD-10-CM | POA: Diagnosis not present

## 2020-10-20 DIAGNOSIS — T148XXA Other injury of unspecified body region, initial encounter: Secondary | ICD-10-CM

## 2020-10-20 MED ORDER — LO LOESTRIN FE 1 MG-10 MCG / 10 MCG PO TABS: 1.0000 | ORAL_TABLET | Freq: Every day | ORAL | 12 refills | Status: DC

## 2020-10-20 MED ORDER — AMLODIPINE BESYLATE 5 MG PO TABS: 5.0000 mg | ORAL_TABLET | Freq: Every day | ORAL | 12 refills | Status: DC

## 2020-10-20 NOTE — Progress Notes (Addendum)
Patient ID: Heidi Clark, female   DOB: 09-05-1990, 30 y.o.   MRN: [id number] History of Present Illness: Heidi Clark is a 30 year old white female,single, G0P0, in for a well woman gyn exam. Lab Results  Component Value Date   DIAGPAP - Benign reactive/reparative changes 09/02/2019   HPVHIGH Negative 09/02/2019   PCP is Dr Heidi Clark.   Current Medications, Allergies, Past Medical History, Past Surgical History, Family History and Social History were reviewed in [employer] record.     Review of Systems: Patient denies any headaches, hearing loss, fatigue, blurred vision, shortness of breath, chest pain, abdominal pain, problems with bowel movements, urination, or intercourse. (Has never had sex).No joint pain or mood swings.  Periods good with Lo Loestrin    Physical Exam:BP (!) 149/90 (BP Location: Left Arm, Patient Position: Sitting, Cuff Size: Normal)   Pulse (!) 130   Ht 5' (1.524 m)   Wt 282 lb 8 oz (128.1 kg)   LMP 10/15/2020   BMI 55.17 kg/m  she is not taking BP meds currently,has lost 6.5 lbs since February. General:  Well developed, well nourished, no acute distress Skin:  Warm and dry Neck:  Midline trachea, normal thyroid, good ROM, no lymphadenopathy Lungs; Clear to auscultation bilaterally Breast:  No dominant palpable mass, retraction, or nipple discharge Cardiovascular: Regular rate and rhythm Abdomen:  Soft, non tender, no hepatosplenomegaly,obese Pelvic:  External genitalia is normal in appearance, no lesions.  The vagina is normal in appearance. Used small Pederson speculum.Urethra has no lesions or masses. The cervix is smooth.  Uterus is felt to be normal size, shape, and contour.  No adnexal masses or tenderness noted.Bladder is non tender, no masses felt. Rectal:Deferred Extremities/musculoskeletal:  No swelling or varicosities noted, no clubbing or cyanosis,she has skin abrasion right arm 4 x 2.5 cm that is slowly healing, has had a week,  does not know she got it, she says she has no pain  Psych:  No mood changes, alert and cooperative,seems happy AA is 1 Fall risk is low Depression screen [facility] 2/9 10/20/2020 04/02/2020 09/02/2019  Decreased Interest 0 0 0  Down, Depressed, Hopeless 0 0 0  PHQ - 2 Score 0 0 0  Altered sleeping 0 - 0  Tired, decreased energy 0 - 0  Change in appetite 0 - 0  Feeling bad or failure about yourself  0 - 0  Trouble concentrating 0 - 0  Moving slowly or fidgety/restless 0 - 0  Suicidal thoughts 0 - 0  PHQ-9 Score 0 - 0    GAD 7 : Generalized Anxiety Score 10/20/2020 09/02/2019  Nervous, Anxious, on Edge 0 0  Control/stop worrying 0 0  Worry too much - different things 0 0  Trouble relaxing 0 0  Restless 0 0  Easily annoyed or irritable 0 0  Afraid - awful might happen 0 0  Total GAD 7 Score 0 0      Upstream - 10/20/20 0910       Pregnancy Intention Screening   Does the patient want to become pregnant in the next year? No    Does the patient's partner want to become pregnant in the next year? No    Would the patient like to discuss contraceptive options today? No      Contraception Wrap Up   Current Method Oral Contraceptive;Abstinence    End Method Oral Contraceptive;Abstinence    Contraception Counseling Provided No  Examination chaperoned by Malachy Mood LPN  Impression and Plan: 1. Encounter for well woman exam with routine gynecological exam Physical in 1 year  Pap in 2024 Will check fasting labs today - CBC - Comprehensive metabolic panel - TSH - Lipid panel  2. Encounter for menstrual regulation Will refill Lo Loestrin Meds ordered this encounter  Medications   amLODipine (NORVASC) 5 MG tablet    Sig: Take 1 tablet (5 mg total) by mouth daily.    Dispense:  30 tablet    Refill:  12    Order Specific Question:   Supervising Provider    Answer:   Duane Lope H [[id number]]   Norethindrone-Ethinyl Estradiol-Fe Biphas (LO LOESTRIN FE) 1 MG-10 MCG / 10 MCG  tablet    Sig: Take 1 tablet by mouth daily.    Dispense:  28 tablet    Refill:  12    Order Specific Question:   Supervising Provider    Answer:   Despina Hidden, LUTHER H [[id number]]     3. Polycystic ovarian syndrome   4. Morbid obesity (HCC) Continue to work on losing some weight, walk more   5. Hypertension, unspecified type Take BP meds, will refill   6. Skin abrasion See PCP if not healed in day or 2   7. Elevated hemoglobin A1c  - Hemoglobin A1c  8. Low vitamin D level Continue taking Vitamin D  - VITAMIN D 25 Hydroxy (Vit-D Deficiency, Fractures)

## 2020-10-21 ENCOUNTER — Telehealth: Payer: Self-pay | Admitting: Adult Health

## 2020-10-21 ENCOUNTER — Other Ambulatory Visit: Payer: Self-pay | Admitting: *Deleted

## 2020-10-21 DIAGNOSIS — D649 Anemia, unspecified: Secondary | ICD-10-CM

## 2020-10-21 LAB — CBC
Hematocrit: 25.8 % — ABNORMAL LOW (ref 34.0–46.6)
Hemoglobin: 6.3 g/dL — CL (ref 11.1–15.9)
MCH: 15.4 pg — ABNORMAL LOW (ref 26.6–33.0)
MCHC: 24.4 g/dL — CL (ref 31.5–35.7)
MCV: 63 fL — ABNORMAL LOW (ref 79–97)
NRBC: 1 % — ABNORMAL HIGH (ref 0–0)
Platelets: 363 10*3/uL (ref 150–450)
RBC: 4.1 x10E6/uL (ref 3.77–5.28)
RDW: 20.5 % — ABNORMAL HIGH (ref 11.7–15.4)
WBC: 13.9 10*3/uL — ABNORMAL HIGH (ref 3.4–10.8)

## 2020-10-21 LAB — COMPREHENSIVE METABOLIC PANEL
ALT: 7 IU/L (ref 0–32)
AST: 11 IU/L (ref 0–40)
Albumin/Globulin Ratio: 1.1 — ABNORMAL LOW (ref 1.2–2.2)
Albumin: 4 g/dL (ref 3.9–5.0)
Alkaline Phosphatase: 120 IU/L (ref 44–121)
BUN/Creatinine Ratio: 18 (ref 9–23)
BUN: 14 mg/dL (ref 6–20)
Bilirubin Total: 0.3 mg/dL (ref 0.0–1.2)
CO2: 16 mmol/L — ABNORMAL LOW (ref 20–29)
Calcium: 9.4 mg/dL (ref 8.7–10.2)
Chloride: 102 mmol/L (ref 96–106)
Creatinine, Ser: 0.76 mg/dL (ref 0.57–1.00)
Globulin, Total: 3.6 g/dL (ref 1.5–4.5)
Glucose: 118 mg/dL — ABNORMAL HIGH (ref 65–99)
Potassium: 4.1 mmol/L (ref 3.5–5.2)
Sodium: 138 mmol/L (ref 134–144)
Total Protein: 7.6 g/dL (ref 6.0–8.5)
eGFR: 109 mL/min/{1.73_m2} (ref >59)

## 2020-10-21 LAB — HEMOGLOBIN A1C
Est. average glucose Bld gHb Est-mCnc: 97 mg/dL
Hgb A1c MFr Bld: 5 % (ref 4.8–5.6)

## 2020-10-21 LAB — LIPID PANEL
Chol/HDL Ratio: 4.1 ratio (ref 0.0–4.4)
Cholesterol, Total: 150 mg/dL (ref 100–199)
HDL: 37 mg/dL — ABNORMAL LOW (ref >39)
LDL Chol Calc (NIH): 79 mg/dL (ref 0–99)
Triglycerides: 202 mg/dL — ABNORMAL HIGH (ref 0–149)
VLDL Cholesterol Cal: 34 mg/dL (ref 5–40)

## 2020-10-21 LAB — VITAMIN D 25 HYDROXY (VIT D DEFICIENCY, FRACTURES): Vit D, 25-Hydroxy: 33.5 ng/mL (ref 30.0–100.0)

## 2020-10-21 LAB — TSH: TSH: 3.18 u[IU]/mL (ref 0.450–4.500)

## 2020-10-21 NOTE — Telephone Encounter (Signed)
Denies any heavy periods or rectal bleeding, a little tired. I spoke with her mom too, and she is not aware of any bleeding, they are aware of labs, and that I added iron panel, and she may need iron infusion and further evaluation.

## 2020-10-21 NOTE — Telephone Encounter (Signed)
Left message to call me about labs  

## 2020-10-21 NOTE — Telephone Encounter (Signed)
Patient called stating that she was returning Jennifer's call regarding her lab results. Please contact pt

## 2020-10-22 ENCOUNTER — Telehealth: Payer: Self-pay | Admitting: Adult Health

## 2020-10-22 ENCOUNTER — Other Ambulatory Visit: Payer: Self-pay | Admitting: Adult Health

## 2020-10-22 DIAGNOSIS — D649 Anemia, unspecified: Secondary | ICD-10-CM

## 2020-10-22 NOTE — Progress Notes (Signed)
Refer to hamatology

## 2020-10-22 NOTE — Telephone Encounter (Signed)
Left message waiting on iron panel, placed hematology referral

## 2020-10-23 ENCOUNTER — Other Ambulatory Visit: Payer: Self-pay | Admitting: Adult Health

## 2020-10-23 LAB — SPECIMEN STATUS REPORT

## 2020-10-24 LAB — IRON,TIBC AND FERRITIN PANEL
Ferritin: 4 ng/mL — ABNORMAL LOW (ref 15–150)
Iron Saturation: 3 % — CL (ref 15–55)
Iron: 14 ug/dL — ABNORMAL LOW (ref 27–159)
Total Iron Binding Capacity: 531 ug/dL — ABNORMAL HIGH (ref 250–450)
UIBC: 517 ug/dL — ABNORMAL HIGH (ref 131–425)

## 2020-10-24 LAB — SPECIMEN STATUS REPORT

## 2020-10-26 ENCOUNTER — Telehealth: Payer: Self-pay | Admitting: Adult Health

## 2020-10-26 NOTE — Telephone Encounter (Signed)
Left message on Heidi Clark's phone and her moms's call me about labs

## 2020-10-26 NOTE — Telephone Encounter (Signed)
Pt and mom both aware that HGB 6.3 and ferritin level is 4, she needs to get some blood, go to ER at [facility], hr mom says they can't go today, no transportation, but will go tomorrow when husband off work, will be there at !2 N. Denies any bleeding

## 2020-10-27 ENCOUNTER — Other Ambulatory Visit: Payer: Self-pay

## 2020-10-27 ENCOUNTER — Encounter (HOSPITAL_COMMUNITY): Payer: Self-pay | Admitting: Emergency Medicine

## 2020-10-27 ENCOUNTER — Emergency Department (HOSPITAL_COMMUNITY)
Admission: EM | Admit: 2020-10-27 | Discharge: 2020-10-27 | Disposition: A | Discharge status: Home / Self Care | Payer: Medicaid Other | Attending: Emergency Medicine | Admitting: Emergency Medicine

## 2020-10-27 DIAGNOSIS — R000 Tachycardia, unspecified: Secondary | ICD-10-CM | POA: Insufficient documentation

## 2020-10-27 DIAGNOSIS — R799 Abnormal finding of blood chemistry, unspecified: Secondary | ICD-10-CM | POA: Diagnosis present

## 2020-10-27 DIAGNOSIS — D649 Anemia, unspecified: Secondary | ICD-10-CM | POA: Diagnosis not present

## 2020-10-27 DIAGNOSIS — Z79899 Other long term (current) drug therapy: Secondary | ICD-10-CM | POA: Insufficient documentation

## 2020-10-27 DIAGNOSIS — I10 Essential (primary) hypertension: Secondary | ICD-10-CM | POA: Insufficient documentation

## 2020-10-27 LAB — CBC WITH DIFFERENTIAL/PLATELET
Abs Immature Granulocytes: 0.06 10*3/uL (ref 0.00–0.07)
Basophils Absolute: 0.1 10*3/uL (ref 0.0–0.1)
Basophils Relative: 1 %
Eosinophils Absolute: 0.5 10*3/uL (ref 0.0–0.5)
Eosinophils Relative: 5 %
HCT: 25.8 % — ABNORMAL LOW (ref 36.0–46.0)
Hemoglobin: 6.1 g/dL — CL (ref 12.0–15.0)
Immature Granulocytes: 1 %
Lymphocytes Relative: 15 %
Lymphs Abs: 1.5 10*3/uL (ref 0.7–4.0)
MCH: 15.8 pg — ABNORMAL LOW (ref 26.0–34.0)
MCHC: 23.6 g/dL — ABNORMAL LOW (ref 30.0–36.0)
MCV: 67 fL — ABNORMAL LOW (ref 80.0–100.0)
Monocytes Absolute: 0.5 10*3/uL (ref 0.1–1.0)
Monocytes Relative: 5 %
Neutro Abs: 7.3 10*3/uL (ref 1.7–7.7)
Neutrophils Relative %: 73 %
Platelets: 329 10*3/uL (ref 150–400)
RBC: 3.85 MIL/uL — ABNORMAL LOW (ref 3.87–5.11)
RDW: 21.2 % — ABNORMAL HIGH (ref 11.5–15.5)
WBC: 9.8 10*3/uL (ref 4.0–10.5)
nRBC: 1.1 % — ABNORMAL HIGH (ref 0.0–0.2)

## 2020-10-27 LAB — COMPREHENSIVE METABOLIC PANEL
ALT: 11 U/L (ref 0–44)
AST: 16 U/L (ref 15–41)
Albumin: 3.2 g/dL — ABNORMAL LOW (ref 3.5–5.0)
Alkaline Phosphatase: 93 U/L (ref 38–126)
Anion gap: 9 (ref 5–15)
BUN: 14 mg/dL (ref 6–20)
CO2: 22 mmol/L (ref 22–32)
Calcium: 8.9 mg/dL (ref 8.9–10.3)
Chloride: 108 mmol/L (ref 98–111)
Creatinine, Ser: 0.68 mg/dL (ref 0.44–1.00)
GFR, Estimated: >60 mL/min (ref >60)
Glucose, Bld: 114 mg/dL — ABNORMAL HIGH (ref 70–99)
Potassium: 3.7 mmol/L (ref 3.5–5.1)
Sodium: 139 mmol/L (ref 135–145)
Total Bilirubin: 0.4 mg/dL (ref 0.3–1.2)
Total Protein: 8 g/dL (ref 6.5–8.1)

## 2020-10-27 LAB — PREPARE RBC (CROSSMATCH)

## 2020-10-27 LAB — ABO/RH: ABO/RH(D): B NEG

## 2020-10-27 LAB — HEMOGLOBIN AND HEMATOCRIT, BLOOD
HCT: 26.5 % — ABNORMAL LOW (ref 36.0–46.0)
Hemoglobin: 6.7 g/dL — CL (ref 12.0–15.0)

## 2020-10-27 MED ORDER — SODIUM CHLORIDE 0.9 % IV SOLN
10.0000 mL/h | Freq: Once | INTRAVENOUS | Status: AC
  Administered 2020-10-27: 10 mL/h via INTRAVENOUS

## 2020-10-27 NOTE — ED Triage Notes (Addendum)
Pt states she was sent to ED from [facility] for blood transfusion.  Pt states she had lab work drawn there on August 9th.

## 2020-10-27 NOTE — ED Provider Notes (Signed)
   Patient signed out to me by Berle Mull, PA-C pending completion of blood transfusion.  Patient sent here from OB/GYN's office for blood transfusion.  She was at OB/GYN's office recently and found to have hemoglobin of 6.3.  She is currently receiving 2 units of blood.  Suspected iron deficiency.  On my exam, patient well-appearing nontoxic.  She has been observed in the department after transfusion completed.  No complications.  Patient has PCP follow-up this week and will also follow-up with Dr. Ellin Saba.   Pauline Aus, PA-C 10/27/20 2330    Vanetta Mulders, MD 10/30/20 [phone]

## 2020-10-27 NOTE — ED Provider Notes (Signed)
[facility] EMERGENCY DEPARTMENT Provider Note   CSN: [id number] Arrival date & time: 10/27/20  1155     History Chief Complaint  Patient presents with   Abnormal Lab    [facility] is a 30 y.o. female.  HPI  Patient with significant medical history of ADHD, GERD, obesity, PCOS, hypertension presents with chief complaint of low hemoglobin.  Patient was seen at her OB/GYN on 08/09 where basic lab work were obtained, she was notified today that she had a hemoglobin of 6.3 and was directed to come here for further evaluation.  Patient denies  complaints at this time, she does not endorse fatigue, chest pain, shortness of breath, dizziness, she denies abnormal bleeding, nosebleeds, abnormal bruising, she denies menorrhea, denies dark tarry stools.  Patient states she has never had this in the past, is never had  blood transfusions before.  Patient not endorse fevers, chills, chest pain, shortness breath, abdominal pain,  nausea, vomit, diarrhea.  Past Medical History:  Diagnosis Date   ADHD (attention deficit hyperactivity disorder)    ADHD (attention deficit hyperactivity disorder)    Elevated BP without diagnosis of hypertension 09/02/2019   Encounter for menstrual regulation 09/25/2015   GERD (gastroesophageal reflux disease)    Headache(784.0)    Insulin resistance    Obesity    Polycystic ovarian syndrome    Rash and nonspecific skin eruption 02/27/2020    Patient Active Problem List   Diagnosis Date Noted   Skin abrasion 10/20/2020   Elevated hemoglobin A1c 10/20/2020   Low vitamin D level 10/20/2020   Tinnitus of right ear 04/02/2020   Chronic cough 02/27/2020   Hypertension 11/25/2019   Encounter for well woman exam with routine gynecological exam 09/02/2019   Encounter for menstrual regulation 11/30/2017   Morbid obesity (HCC) 11/30/2017   Well woman exam with routine gynecological exam 09/25/2015   Polycystic ovarian syndrome 08/21/2014   ADHD (attention  deficit hyperactivity disorder)    Insulin resistance    Legg-Calve-Perthes disease    Class 3 obesity due to excess calories without serious comorbidity with body mass index (BMI) of 50.0 to 59.9 in adult    GERD (gastroesophageal reflux disease) 10/13/2010   Obesity 10/13/2010    Past Surgical History:  Procedure Laterality Date   Right hip     Age 63   TOOTH EXTRACTION N/A 08/12/2016   Procedure: DENTAL RESTORATION/EXTRACTIONS;  Surgeon: Ocie Doyne, DDS;  Location: [facility] OR;  Service: Oral Surgery;  Laterality: N/A;     OB History     Gravida  0   Para      Term      Preterm      AB      Living         SAB      IAB      Ectopic      Multiple      Live Births              Family History  Problem Relation Age of Onset   Diabetes Mother    Obesity Mother    Colon cancer Neg Hx     Social History   Tobacco Use   Smoking status: Never   Smokeless tobacco: Never  Vaping Use   Vaping Use: Never used  Substance Use Topics   Alcohol use: No   Drug use: No    Home Medications Prior to Admission medications   Medication Sig Start Date End Date Taking? Authorizing  Provider  omeprazole (PRILOSEC) 20 MG capsule Take 1 capsule (20 mg total) by mouth daily. 03/19/20  Yes [name], NP  albuterol (VENTOLIN HFA) 108 (90 Base) MCG/ACT inhaler Inhale 2 puffs into the lungs every 6 (six) hours as needed for wheezing or shortness of breath. 03/19/20   [name], NP  amLODipine (NORVASC) 5 MG tablet Take 1 tablet (5 mg total) by mouth daily. 10/20/20   Adline Potter, NP  budesonide-formoterol (SYMBICORT) 160-4.5 MCG/ACT inhaler Inhale 2 puffs into the lungs 2 (two) times daily. 04/23/20   [name], NP  cetirizine (ZYRTEC) 10 MG tablet Take 1 tablet (10 mg total) by mouth daily. 02/27/20   [name], NP  Cholecalciferol 5000 units capsule Take 1 capsule (5,000 Units total) by mouth daily. 10/03/15   Adline Potter, NP   fluticasone (FLONASE) 50 MCG/ACT nasal spray Place 2 sprays into both nostrils daily. 04/08/20   [provider]  Norethindrone-Ethinyl Estradiol-Fe Biphas (LO LOESTRIN FE) 1 MG-10 MCG / 10 MCG tablet Take 1 tablet by mouth daily. 10/20/20   Adline Potter, NP    Allergies    No known allergies  Review of Systems   Review of Systems  Constitutional:  Negative for chills and fever.  HENT:  Negative for congestion.   Respiratory:  Negative for shortness of breath.   Cardiovascular:  Negative for chest pain.  Gastrointestinal:  Negative for abdominal pain.  Genitourinary:  Negative for enuresis.  Musculoskeletal:  Negative for back pain.  Skin:  Negative for rash.  Neurological:  Negative for dizziness.  Hematological:  Does not bruise/bleed easily.   Physical Exam Updated Vital Signs BP (!) 142/76   Pulse (!) 106   Temp 98.6 F (37 C) (Oral)   Resp (!) 21   Ht 5' (1.524 m)   Wt 128.1 kg   LMP 10/15/2020   SpO2 92%   BMI 55.17 kg/m   Physical Exam Vitals and nursing note reviewed. Exam conducted with a chaperone present.  Constitutional:      General: She is not in acute distress.    Appearance: She is not ill-appearing.  HENT:     Head: Normocephalic and atraumatic.     Nose: No congestion.     Mouth/Throat:     Mouth: Mucous membranes are moist.     Pharynx: Oropharynx is clear.  Eyes:     Conjunctiva/sclera: Conjunctivae normal.  Cardiovascular:     Rate and Rhythm: Regular rhythm. Tachycardia present.     Pulses: Normal pulses.     Heart sounds: No murmur heard.   No friction rub. No gallop.  Pulmonary:     Effort: No respiratory distress.     Breath sounds: No wheezing, rhonchi or rales.  Abdominal:     Palpations: Abdomen is soft.     Tenderness: There is no abdominal tenderness. There is no right CVA tenderness or left CVA tenderness.  Genitourinary:    Rectum: Guaiac result negative.     Comments: With chaperone present rectal exam was  performed there is no noted external hemorrhoids present, no palpable defects noted within the rectum, patient is nontender to palpation, Hemoccult was negative. Skin:    General: Skin is warm and dry.     Coloration: Skin is pale.  Neurological:     Mental Status: She is alert.  Psychiatric:        Mood and Affect: Mood normal.    ED Results / Procedures /  Treatments   Labs (all labs ordered are listed, but only abnormal results are displayed) Labs Reviewed  CBC WITH DIFFERENTIAL/PLATELET - Abnormal; Notable for the following components:      Result Value   RBC 3.85 (*)    Hemoglobin 6.1 (*)    HCT 25.8 (*)    MCV 67.0 (*)    MCH 15.8 (*)    MCHC 23.6 (*)    RDW 21.2 (*)    nRBC 1.1 (*)    All other components within normal limits  COMPREHENSIVE METABOLIC PANEL - Abnormal; Notable for the following components:   Glucose, Bld 114 (*)    Albumin 3.2 (*)    All other components within normal limits  HEMOGLOBIN AND HEMATOCRIT, BLOOD - Abnormal; Notable for the following components:   Hemoglobin 6.7 (*)    HCT 26.5 (*)    All other components within normal limits  POC OCCULT BLOOD, ED  TYPE AND SCREEN  PREPARE RBC (CROSSMATCH)  ABO/RH  PREPARE RBC (CROSSMATCH)    EKG EKG Interpretation  Date/Time:  Tuesday October 27 2020 14:44:30 EDT Ventricular Rate:  107 PR Interval:  144 QRS Duration: 81 QT Interval:  344 QTC Calculation: 459 R Axis:   67 Text Interpretation: Sinus tachycardia Low voltage, precordial leads Confirmed by Vanetta Mulders [phone]) on 10/27/2020 6:50:32 PM  Radiology No results found.  Procedures .Critical Care  Date/Time: 10/27/2020 7:27 PM Performed by: Carroll Sage, PA-C Authorized by: Carroll Sage, PA-C   Critical care provider statement:    Critical care time (minutes):  45   Critical care was necessary to treat or prevent imminent or life-threatening deterioration of the following conditions:  Circulatory failure   Critical  care was time spent personally by me on the following activities:  Discussions with consultants, evaluation of patient's response to treatment, examination of patient, ordering and performing treatments and interventions, ordering and review of laboratory studies, ordering and review of radiographic studies, pulse oximetry, re-evaluation of patient's condition and review of old charts   I assumed direction of critical care for this patient from another provider in my specialty: no     Medications Ordered in ED Medications  0.9 %  sodium chloride infusion (has no administration in time range)  0.9 %  sodium chloride infusion (0 mL/hr Intravenous Stopped 10/27/20 1702)    ED Course  I have reviewed the triage vital signs and the nursing notes.  Pertinent labs & imaging results that were available during my care of the patient were reviewed by me and considered in my medical decision making (see chart for details).    MDM Rules/Calculators/A&P                          Initial impression-patient presents with low hemoglobin.  She is alert, does not appear acute stress, vital signs are for tachycardia.  Will obtain basic lab work-up, continue to monitor.  Work-up-CBC shows microcytic anemia hemoglobin 6.1, CMP shows hyperglycemia of 114, Hemoccult was negative.  Reassessment-patient has a hemoglobin of 6.1 will recommend transfusion, went over risk and benefits, patient was in agreement with this plan, all questions were answered  Patient was reassessed, continued to have no complaints at this time, tachycardia has improved.   Patient was reassessed after finishing transfusion has no complaints at this time, vital signs tachycardia has resolved, will obtain H&H and reassess.  Hemoglobin was 6.7, recommend an additional unit of blood, patient and guardian  were in agreement this plan.  Will order second unit.   Rule out-low suspicion for systemic infection as patient is nontoxic-appearing,  vital signs reassuring.  She was noted to be tachycardic but this has since resolved likely secondary due to microcytic anemia.  Low suspicion for GI bleed as she is Hemoccult negative, she has no abdominal tenderness, no rebound tenderness, no peritoneal sign.  low suspicion for hepatic coagulopathy she has no elevation liver enzymes, elevation in T bili, no right upper quadrant pain, no jaundice on my exam.  Low suspicion for bleeding disorder as she has no abnormal bruising, denies abnormal bleeding i.e. epistasis or menorrhea.  Plan-  Anemia-suspect this is iron deficient anemia as she has elevated iron binding capacity with low iron saturations.  Will recommend that she follows up with hematology for further evaluation.  Due to shift change patiently handed off to [facility] PA-C she was provided HPI, current work-up, likely disposition.  Follow-up on blood transfusion patient patient is hemodynamically stable has no complaints patient can be discharged home.  She will follow-up with hematology for further evaluation.  Final Clinical Impression(s) / ED Diagnoses Final diagnoses:  Symptomatic anemia    Rx / DC Orders ED Discharge Orders     None        Carroll SageFaulkner, Chance Munter J, PA-C 10/27/20 1930    Bethann BerkshireZammit, Joseph, MD 10/28/20 [phone]

## 2020-10-27 NOTE — Discharge Instructions (Addendum)
Lab work revealed you had a low hemoglobin I suspect this is from low iron in your blood.  I have given you 2 units of blood I have increased your hemoglobin.  You must follow-up with hematology for further evaluation given contact information above please call  Come back to the emergency department if you develop chest pain, shortness of breath, severe abdominal pain, uncontrolled nausea, vomiting, diarrhea.

## 2020-10-28 ENCOUNTER — Telehealth: Payer: Self-pay

## 2020-10-28 LAB — TYPE AND SCREEN
ABO/RH(D): B NEG
Antibody Screen: NEGATIVE
Unit division: 0
Unit division: 0

## 2020-10-28 LAB — BPAM RBC
Blood Product Expiration Date: 202209132359
Blood Product Expiration Date: 202209152359
ISSUE DATE / TIME: 202208161447
ISSUE DATE / TIME: 202208162000
Unit Type and Rh: 1700
Unit Type and Rh: 1700

## 2020-10-28 NOTE — Telephone Encounter (Signed)
Transition Care Management Unsuccessful Follow-up Telephone Call  Date of discharge and from where:  10/27/2020-[facility] ED  Attempts:  1st Attempt  Reason for unsuccessful TCM follow-up call:  Left voice message

## 2020-10-29 ENCOUNTER — Encounter: Payer: Self-pay | Admitting: Family Medicine

## 2020-10-29 ENCOUNTER — Ambulatory Visit: Payer: Medicaid Other | Admitting: Family Medicine

## 2020-10-29 ENCOUNTER — Other Ambulatory Visit: Payer: Self-pay

## 2020-10-29 VITALS — BP 138/84 | HR 98 | Temp 97.8°F | Resp 16 | Ht 60.0 in | Wt 283.0 lb

## 2020-10-29 DIAGNOSIS — L309 Dermatitis, unspecified: Secondary | ICD-10-CM

## 2020-10-29 DIAGNOSIS — D509 Iron deficiency anemia, unspecified: Secondary | ICD-10-CM | POA: Diagnosis not present

## 2020-10-29 MED ORDER — MOMETASONE FUROATE 0.1 % EX CREA: TOPICAL_CREAM | Freq: Every day | CUTANEOUS | 1 refills | Status: DC

## 2020-10-29 MED ORDER — IRON (FERROUS SULFATE) 325 (65 FE) MG PO TABS: 325.0000 mg | ORAL_TABLET | Freq: Two times a day (BID) | ORAL | 1 refills | Status: DC

## 2020-10-29 NOTE — Telephone Encounter (Signed)
Transition Care Management Unsuccessful Follow-up Telephone Call  Date of discharge and from where:  10/27/2020[facility]   Attempts:  2nd Attempt  Reason for unsuccessful TCM follow-up call:  Left voice message

## 2020-10-29 NOTE — Progress Notes (Signed)
Subjective:    Patient ID: [facility]idi Clark, female    DOB: [birth year], 30 y.o.   MRN: [id number]  HPI  Patient is a 30 year old Caucasian female with a history of morbid obesity, insulin resistant, and polycystic ovary syndrome.  She was recently seen by her gynecologist and found to have a hemoglobin of 6.3.  It was a microcytic anemia.  Repeat hemoglobin had dropped to 6.1.  She was referred to the emergency room where she received a transfusion.  She is here today for follow-up.  She is not on any iron.  She is on birth control pills and she states that she has a period that last about 5 days.  She gives me conflicting information.  At times she states that the periods are heavy despite taking the birth control pills.  At other times she states that its not heavy anymore.  She denies any rectal bleeding or melena.  She also has a rash on the dorsum of her right forearm.  Is been there for several months.  For the last month she has been putting a "cream" on it although she is not sure the name or type of cream.  Her father has severe eczema.  She also has eczema-like skin changes seen on both arms.  This is approximately 6 cm x 4.5 cm.  Is a large scab with lichenification and a surrounding papulosquamous eruption.  There is no erythema or warmth to suggest infection Past Medical History:  Diagnosis Date   ADHD (attention deficit hyperactivity disorder)    ADHD (attention deficit hyperactivity disorder)    Elevated BP without diagnosis of hypertension 09/02/2019   Encounter for menstrual regulation 09/25/2015   GERD (gastroesophageal reflux disease)    Headache(784.0)    Insulin resistance    Obesity    Polycystic ovarian syndrome    Rash and nonspecific skin eruption 02/27/2020   Past Surgical History:  Procedure Laterality Date   Right hip     Age 11   TOOTH EXTRACTION N/A 08/12/2016   Procedure: DENTAL RESTORATION/EXTRACTIONS;  Surgeon: Ocie Doyne, DDS;  Location: [facility] OR;  Service: Oral  Surgery;  Laterality: N/A;   Current Outpatient Medications on File Prior to Visit  Medication Sig Dispense Refill   albuterol (VENTOLIN HFA) 108 (90 Base) MCG/ACT inhaler Inhale 2 puffs into the lungs every 6 (six) hours as needed for wheezing or shortness of breath. 8 g 0   amLODipine (NORVASC) 5 MG tablet Take 1 tablet (5 mg total) by mouth daily. 30 tablet 12   budesonide-formoterol (SYMBICORT) 160-4.5 MCG/ACT inhaler Inhale 2 puffs into the lungs 2 (two) times daily. (Patient not taking: No sig reported) 1 each 3   cetirizine (ZYRTEC) 10 MG tablet Take 1 tablet (10 mg total) by mouth daily. (Patient not taking: No sig reported) 30 tablet 3   Cholecalciferol 5000 units capsule Take 1 capsule (5,000 Units total) by mouth daily. (Patient not taking: No sig reported)     fluticasone (FLONASE) 50 MCG/ACT nasal spray Place 2 sprays into both nostrils daily.     Norethindrone-Ethinyl Estradiol-Fe Biphas (LO LOESTRIN FE) 1 MG-10 MCG / 10 MCG tablet Take 1 tablet by mouth daily. 28 tablet 12   omeprazole (PRILOSEC) 20 MG capsule Take 1 capsule (20 mg total) by mouth daily. (Patient not taking: No sig reported) 30 capsule 3   No current facility-administered medications on file prior to visit.   Allergies  Allergen Reactions   No Known Allergies  Social History   Socioeconomic History   Marital status: Single    Spouse name: Not on file   Number of children: Not on file   Years of education: Not on file   Highest education level: Not on file  Occupational History   Not on file  Tobacco Use   Smoking status: Never   Smokeless tobacco: Never  Vaping Use   Vaping Use: Never used  Substance and Sexual Activity   Alcohol use: No   Drug use: No   Sexual activity: Never    Birth control/protection: Other-see comments, Pill    Comment: CELIBATE  Other Topics Concern   Not on file  Social History Narrative   Not on file   Social Determinants of Health   Financial Resource Strain:  Low Risk    Difficulty of Paying Living Expenses: Not hard at all  Food Insecurity: Food Insecurity Present   Worried About Programme researcher, broadcasting/film/video in the Last Year: Sometimes true   Barista in the Last Year: Sometimes true  Transportation Needs: No Transportation Needs   Lack of Transportation (Medical): No   Lack of Transportation (Non-Medical): No  Physical Activity: Inactive   Days of Exercise per Week: 0 days   Minutes of Exercise per Session: 0 min  Stress: No Stress Concern Present   Feeling of Stress : Not at all  Social Connections: Socially Isolated   Frequency of Communication with Friends and Family: Never   Frequency of Social Gatherings with Friends and Family: Never   Attends Religious Services: Never   Database administrator or Organizations: No   Attends Engineer, structural: Never   Marital Status: Never married  Catering manager Violence: Not At Risk   Fear of Current or Ex-Partner: No   Emotionally Abused: No   Physically Abused: No   Sexually Abused: No      Review of Systems  All other systems reviewed and are negative.     Objective:   Physical Exam Vitals reviewed.  Constitutional:      General: She is not in acute distress.    Appearance: She is obese. She is not ill-appearing or toxic-appearing.  Cardiovascular:     Rate and Rhythm: Normal rate and regular rhythm.     Heart sounds: No murmur heard.   No gallop.  Pulmonary:     Effort: Pulmonary effort is normal. No respiratory distress.     Breath sounds: Normal breath sounds. No stridor. No wheezing or rhonchi.  Musculoskeletal:       Arms:     Right lower leg: No edema.     Left lower leg: No edema.  Neurological:     Mental Status: She is alert.          Assessment & Plan:  Microcytic anemia - Plan: Iron, Vitamin B12  Eczema, unspecified type Patient has microcytic anemia.  I suspect is due to iron deficiency coupled with dysfunctional uterine bleeding.  I will  start the patient on ferrous sulfate 325 mg twice daily and check an iron level today along with a B12 level.  Repeat iron level and CBC in 1 month to see if this is improving.  She already has an appointment per the hospital records to see hematologist.  If she is absorbing iron and its not improving she may need to consider an IUD to better manage her vaginal bleeding.  I believe the rash on her forearm is eczema.  Try Elocon cream once daily and keep the area covered to avoid scratching and picking at the wound.  Recheck in 1 month

## 2020-10-30 ENCOUNTER — Other Ambulatory Visit: Payer: Self-pay | Admitting: *Deleted

## 2020-10-30 DIAGNOSIS — D509 Iron deficiency anemia, unspecified: Secondary | ICD-10-CM

## 2020-10-30 DIAGNOSIS — E539 Vitamin B deficiency, unspecified: Secondary | ICD-10-CM

## 2020-10-30 LAB — VITAMIN B12: Vitamin B-12: 239 pg/mL (ref 200–1100)

## 2020-10-30 LAB — IRON: Iron: 21 ug/dL — ABNORMAL LOW (ref 40–190)

## 2020-10-30 MED ORDER — VITAMIN B-12 1000 MCG PO TABS: 1000.0000 ug | ORAL_TABLET | Freq: Every day | ORAL | Status: AC

## 2020-10-30 NOTE — Telephone Encounter (Signed)
Transition Care Management Unsuccessful Follow-up Telephone Call  Date of discharge and from where:  10/27/2020-[facility]   Attempts:  3rd Attempt  Reason for unsuccessful TCM follow-up call:  Left voice message

## 2020-11-03 ENCOUNTER — Ambulatory Visit (HOSPITAL_COMMUNITY): Payer: Medicaid Other | Admitting: Hematology

## 2020-11-12 ENCOUNTER — Ambulatory Visit (HOSPITAL_COMMUNITY): Payer: Medicaid Other | Admitting: Hematology

## 2020-11-20 ENCOUNTER — Other Ambulatory Visit: Payer: Self-pay | Admitting: Family Medicine

## 2020-11-26 ENCOUNTER — Encounter (HOSPITAL_COMMUNITY): Payer: Self-pay | Admitting: *Deleted

## 2020-11-27 ENCOUNTER — Encounter (HOSPITAL_COMMUNITY): Payer: Self-pay | Admitting: Hematology and Oncology

## 2020-11-27 ENCOUNTER — Inpatient Hospital Stay (HOSPITAL_COMMUNITY): Payer: Medicaid Other | Attending: Hematology and Oncology | Admitting: Hematology and Oncology

## 2020-11-27 ENCOUNTER — Other Ambulatory Visit: Payer: Self-pay

## 2020-11-27 ENCOUNTER — Inpatient Hospital Stay (HOSPITAL_COMMUNITY): Payer: Medicaid Other

## 2020-11-27 VITALS — BP 128/69 | HR 108 | Temp 99.1°F | Resp 16 | Ht 61.0 in | Wt 280.0 lb

## 2020-11-27 DIAGNOSIS — D500 Iron deficiency anemia secondary to blood loss (chronic): Secondary | ICD-10-CM

## 2020-11-27 DIAGNOSIS — Z79899 Other long term (current) drug therapy: Secondary | ICD-10-CM | POA: Insufficient documentation

## 2020-11-27 DIAGNOSIS — D509 Iron deficiency anemia, unspecified: Secondary | ICD-10-CM | POA: Insufficient documentation

## 2020-11-27 DIAGNOSIS — F909 Attention-deficit hyperactivity disorder, unspecified type: Secondary | ICD-10-CM | POA: Insufficient documentation

## 2020-11-27 LAB — COMPREHENSIVE METABOLIC PANEL
ALT: 16 U/L (ref 0–44)
AST: 19 U/L (ref 15–41)
Albumin: 4.1 g/dL (ref 3.5–5.0)
Alkaline Phosphatase: 112 U/L (ref 38–126)
Anion gap: 13 (ref 5–15)
BUN: 14 mg/dL (ref 6–20)
CO2: 20 mmol/L — ABNORMAL LOW (ref 22–32)
Calcium: 9.4 mg/dL (ref 8.9–10.3)
Chloride: 105 mmol/L (ref 98–111)
Creatinine, Ser: 0.68 mg/dL (ref 0.44–1.00)
GFR, Estimated: >60 mL/min (ref >60)
Glucose, Bld: 119 mg/dL — ABNORMAL HIGH (ref 70–99)
Potassium: 4.2 mmol/L (ref 3.5–5.1)
Sodium: 138 mmol/L (ref 135–145)
Total Bilirubin: 0.4 mg/dL (ref 0.3–1.2)
Total Protein: 9.3 g/dL — ABNORMAL HIGH (ref 6.5–8.1)

## 2020-11-27 LAB — CBC WITH DIFFERENTIAL/PLATELET
Abs Immature Granulocytes: 0.03 10*3/uL (ref 0.00–0.07)
Basophils Absolute: 0.1 10*3/uL (ref 0.0–0.1)
Basophils Relative: 1 %
Eosinophils Absolute: 0.4 10*3/uL (ref 0.0–0.5)
Eosinophils Relative: 5 %
HCT: 45.2 % (ref 36.0–46.0)
Hemoglobin: 12.9 g/dL (ref 12.0–15.0)
Immature Granulocytes: 0 %
Lymphocytes Relative: 16 %
Lymphs Abs: 1.4 10*3/uL (ref 0.7–4.0)
MCH: 23.8 pg — ABNORMAL LOW (ref 26.0–34.0)
MCHC: 28.5 g/dL — ABNORMAL LOW (ref 30.0–36.0)
MCV: 83.4 fL (ref 80.0–100.0)
Monocytes Absolute: 0.4 10*3/uL (ref 0.1–1.0)
Monocytes Relative: 4 %
Neutro Abs: 7 10*3/uL (ref 1.7–7.7)
Neutrophils Relative %: 74 %
Platelets: 339 10*3/uL (ref 150–400)
RBC: 5.42 MIL/uL — ABNORMAL HIGH (ref 3.87–5.11)
RDW: 26.2 % — ABNORMAL HIGH (ref 11.5–15.5)
WBC: 9.3 10*3/uL (ref 4.0–10.5)
nRBC: 0 % (ref 0.0–0.2)

## 2020-11-27 LAB — IRON AND TIBC
Iron: 147 ug/dL (ref 28–170)
Saturation Ratios: 25 % (ref 10.4–31.8)
TIBC: 596 ug/dL — ABNORMAL HIGH (ref 250–450)
UIBC: 449 ug/dL

## 2020-11-27 LAB — FERRITIN: Ferritin: 13 ng/mL (ref 11–307)

## 2020-11-27 NOTE — Progress Notes (Signed)
[name] CONSULT NOTE  Patient Care Team: [name], MD as PCP - General (Family Medicine)  CHIEF COMPLAINTS/PURPOSE OF CONSULTATION:  IDA  ASSESSMENT & PLAN:   This is a very pleasant 29 yr old female patient with IDA referred to hematology for additional recommendations.  Iron deficiency anemia affects a large proportion of the wellness population especially females of childbearing age and children.  Common causes of iron deficiency include blood loss, reduced iron absorption because of previous surgeries, dietary restrictions or other malabsorption issues, medications that reduce gastric acidity are due to inherited disorders such as IRIDA due to TMPRSS6 mutation.  I do believe her iron deficiency anemia is likely related to heavy menstrual cycles. Symptoms of iron deficiency usually include fatigue, pica, restless legs, exercise intolerance, exertional dyspnea, headaches and weakness.  She does not have any major concerning symptoms today. Physical examination today was unremarkable except for pallor.   She is currently taking oral iron supplementation for the past month.  She has been tolerating it well. We will repeat labs today CBC, iron panel and ferritin and if she continues to have severe anemia, 1 could consider intravenous iron supplementation for quick correction of anemia. She will return to clinic in 10 days to discuss additional recommendations. Have spoken to her father, explained to him our recommendations for now and he is in agreement. Thank you for consulting [place] the care of this patient.  Please do not hesitate to contact us with any additional questions or concerns.  HISTORY OF PRESENTING ILLNESS:  Heidi Clark 30 y.o. female is here because of IDA.  This is a very pleasant 30 year old female patient with past medical history significant for ADHD referred to hematology for evaluation of iron deficiency anemia.  Patient mentions that she has  been doing okay, had some routine labs which showed severe anemia had 2 units of packed red blood cells.  She cannot tell the difference before or after blood transfusion.  She denies any fatigue, shortness of breath on exertion, dizziness or lightheadedness or ice craving today.  She does report heavy menstruation and she has to change every , menstrual cycles last 4 days.  She does take a birth control but she cannot tell if this helps or not.  Besides menstruation, she denies any other bleeding in her stool or in her urine.  She has been taking oral iron supplementation twice a day for the past month. Rest of the pertinent 10 point ROS reviewed and negative.  MEDICAL HISTORY:  Past Medical History:  Diagnosis Date   ADHD (attention deficit hyperactivity disorder)    ADHD (attention deficit hyperactivity disorder)    Elevated BP without diagnosis of hypertension 09/02/2019   Encounter for menstrual regulation 09/25/2015   GERD (gastroesophageal reflux disease)    Headache(784.0)    Insulin resistance    Obesity    Polycystic ovarian syndrome    Rash and nonspecific skin eruption 02/27/2020    SURGICAL HISTORY: Past Surgical History:  Procedure Laterality Date   Right hip     Age 10   TOOTH EXTRACTION N/A 08/12/2016   Procedure: DENTAL RESTORATION/EXTRACTIONS;  Surgeon: Ocie Doyne, DDS;  Location: [facility] OR;  Service: Oral Surgery;  Laterality: N/A;    SOCIAL HISTORY: Social History   Socioeconomic History   Marital status: Single    Spouse name: Not on file   Number of children: Not on file   Years of education: Not on file   Highest education  level: Not on file  Occupational History   Not on file  Tobacco Use   Smoking status: Never   Smokeless tobacco: Never  Vaping Use   Vaping Use: Never used  Substance and Sexual Activity   Alcohol use: No   Drug use: No   Sexual activity: Never    Birth control/protection: Other-see comments, Pill    Comment: CELIBATE   Other Topics Concern   Not on file  Social History Narrative   Not on file   Social Determinants of Health   Financial Resource Strain: Low Risk    Difficulty of Paying Living Expenses: Not hard at all  Food Insecurity: Food Insecurity Present   Worried About Programme researcher, broadcasting/film/video in the Last Year: Sometimes true   Barista in the Last Year: Sometimes true  Transportation Needs: No Transportation Needs   Lack of Transportation (Medical): No   Lack of Transportation (Non-Medical): No  Physical Activity: Inactive   Days of Exercise per Week: 0 days   Minutes of Exercise per Session: 0 min  Stress: No Stress Concern Present   Feeling of Stress : Not at all  Social Connections: Socially Isolated   Frequency of Communication with Friends and Family: Never   Frequency of Social Gatherings with Friends and Family: Never   Attends Religious Services: Never   Database administrator or Organizations: No   Attends Engineer, structural: Never   Marital Status: Never married  Catering manager Violence: Not At Risk   Fear of Current or Ex-Partner: No   Emotionally Abused: No   Physically Abused: No   Sexually Abused: No    FAMILY HISTORY: Family History  Problem Relation Age of Onset   Diabetes Mother    Obesity Mother    Colon cancer Neg Hx     ALLERGIES:  is allergic to no known allergies.  MEDICATIONS:  Current Outpatient Medications  Medication Sig Dispense Refill   albuterol (VENTOLIN HFA) 108 (90 Base) MCG/ACT inhaler Inhale 2 puffs into the lungs every 6 (six) hours as needed for wheezing or shortness of breath. 8 g 0   amLODipine (NORVASC) 5 MG tablet Take 1 tablet (5 mg total) by mouth daily. 30 tablet 12   budesonide-formoterol (SYMBICORT) 160-4.5 MCG/ACT inhaler Inhale 2 puffs into the lungs 2 (two) times daily. 1 each 3   cetirizine (ZYRTEC) 10 MG tablet Take 1 tablet (10 mg total) by mouth daily. 30 tablet 3   Cholecalciferol 5000 units capsule Take 1  capsule (5,000 Units total) by mouth daily.     ferrous sulfate 325 (65 FE) MG tablet TAKE 1 TABLET BY MOUTH TWICE A DAY 60 tablet 1   fluticasone (FLONASE) 50 MCG/ACT nasal spray Place 2 sprays into both nostrils daily.     mometasone (ELOCON) 0.1 % cream Apply topically daily. 15 g 1   Norethindrone-Ethinyl Estradiol-Fe Biphas (LO LOESTRIN FE) 1 MG-10 MCG / 10 MCG tablet Take 1 tablet by mouth daily. 28 tablet 12   omeprazole (PRILOSEC) 20 MG capsule Take 1 capsule (20 mg total) by mouth daily. 30 capsule 3   vitamin B-12 (CYANOCOBALAMIN) 1000 MCG tablet Take 1 tablet (1,000 mcg total) by mouth daily.     No current facility-administered medications for this visit.    PHYSICAL EXAMINATION:  ECOG PERFORMANCE STATUS: 0 - Asymptomatic  Vitals:   11/27/20 0804  BP: 128/69  Pulse: (!) 108  Resp: 16  Temp: 99.1 F (37.3 C)  SpO2: 97%   Filed Weights   11/27/20 0804  Weight: 280 lb (127 kg)    GENERAL:alert, no distress and comfortable SKIN: skin color, texture, turgor are normal, no rashes or significant lesions EYES: normal, conjunctiva are pink and non-injected, sclera clear OROPHARYNX:no exudate, no erythema and lips, buccal mucosa, and tongue normal  NECK: supple, thyroid normal size, non-tender, without nodularity LYMPH:  no palpable lymphadenopathy in the cervical, axillary or inguinal LUNGS: clear to auscultation and percussion with normal breathing effort HEART: regular rate & rhythm and no murmurs and no lower extremity edema ABDOMEN:abdomen soft, non-tender and normal bowel sounds Musculoskeletal:no cyanosis of digits and no clubbing  PSYCH: alert & oriented x 3 with fluent speech NEURO: no focal motor/sensory deficits  LABORATORY DATA:  I have reviewed the data as listed Lab Results  Component Value Date   WBC 9.8 10/27/2020   HGB 6.7 (LL) 10/27/2020   HCT 26.5 (L) 10/27/2020   MCV 67.0 (L) 10/27/2020   PLT 329 10/27/2020     Chemistry      Component Value  Date/Time   NA 139 10/27/2020 1247   NA 138 10/20/2020 0942   K 3.7 10/27/2020 1247   CL 108 10/27/2020 1247   CO2 22 10/27/2020 1247   BUN 14 10/27/2020 1247   BUN 14 10/20/2020 0942   CREATININE 0.68 10/27/2020 1247   CREATININE 0.64 09/24/2018 1144      Component Value Date/Time   CALCIUM 8.9 10/27/2020 1247   ALKPHOS 93 10/27/2020 1247   AST 16 10/27/2020 1247   ALT 11 10/27/2020 1247   BILITOT 0.4 10/27/2020 1247   BILITOT 0.3 10/20/2020 0942     CBC from 10/20/2020 showed a white blood cell count of 13.9, hemoglobin of 6.3, MCV of 63, MCH of 15.4 and a platelet count of 363,000. CMP showed sodium of 138, potassium 4.1, chloride of 102, bicarb of 16, normal total protein, normal liver function tests.  Iron panel and ferritin showed a ferritin of 4, iron saturation of 3, TIBC of 531.  RADIOGRAPHIC STUDIES: I have personally reviewed the radiological images as listed and agreed with the findings in the report. No results found.  All questions were answered. The patient knows to call the clinic with any problems, questions or concerns. I spent 45 minutes in the care of this patient including H and P, review of records, counseling and coordination of care.     Heidi Moulds, MD 11/27/2020 8:18 AM

## 2020-11-30 ENCOUNTER — Ambulatory Visit: Payer: Medicaid Other | Admitting: Family Medicine

## 2020-12-01 ENCOUNTER — Ambulatory Visit (INDEPENDENT_AMBULATORY_CARE_PROVIDER_SITE_OTHER): Payer: Medicaid Other | Admitting: Family Medicine

## 2020-12-01 ENCOUNTER — Encounter: Payer: Self-pay | Admitting: Family Medicine

## 2020-12-01 ENCOUNTER — Other Ambulatory Visit: Payer: Self-pay

## 2020-12-01 VITALS — BP 130/78 | HR 98 | Temp 97.6°F | Resp 16 | Ht 61.0 in | Wt 283.0 lb

## 2020-12-01 DIAGNOSIS — E539 Vitamin B deficiency, unspecified: Secondary | ICD-10-CM | POA: Diagnosis not present

## 2020-12-01 DIAGNOSIS — Z23 Encounter for immunization: Secondary | ICD-10-CM | POA: Diagnosis not present

## 2020-12-01 DIAGNOSIS — D509 Iron deficiency anemia, unspecified: Secondary | ICD-10-CM

## 2020-12-01 NOTE — Progress Notes (Signed)
Subjective:    Patient ID: Heidi Clark, female    DOB: October 06, 1990, 30 y.o.   MRN: [id number]  HPI  10/29/20 Patient is a 30 year old Caucasian female with a history of morbid obesity, insulin resistant, and polycystic ovary syndrome.  She was recently seen by her gynecologist and found to have a hemoglobin of 6.3.  It was a microcytic anemia.  Repeat hemoglobin had dropped to 6.1.  She was referred to the emergency room where she received a transfusion.  She is here today for follow-up.  She is not on any iron.  She is on birth control pills and she states that she has a period that last about 5 days.  She gives me conflicting information.  At times she states that the periods are heavy despite taking the birth control pills.  At other times she states that its not heavy anymore.  She denies any rectal bleeding or melena.  She also has a rash on the dorsum of her right forearm.  Is been there for several months.  For the last month she has been putting a "cream" on it although she is not sure the name or type of cream.  Her father has severe eczema.  She also has eczema-like skin changes seen on both arms.  This is approximately 6 cm x 4.5 cm.  Is a large scab with lichenification and a surrounding papulosquamous eruption.  There is no erythema or warmth to suggest infection.  At that time, my plan was: Patient has microcytic anemia.  I suspect is due to iron deficiency coupled with dysfunctional uterine bleeding.  I will start the patient on ferrous sulfate 325 mg twice daily and check an iron level today along with a B12 level.  Repeat iron level and CBC in 1 month to see if this is improving.  She already has an appointment per the hospital records to see hematologist.  If she is absorbing iron and its not improving she may need to consider an IUD to better manage her vaginal bleeding.  I believe the rash on her forearm is eczema.  Try Elocon cream once daily and keep the area covered to avoid  scratching and picking at the wound.  Recheck in 1 month  12/01/20 Patient saw hematologist yesterday.  I reviewed the lab work from that appointment.  Hemoglobin is now 12.9 up from 6.7!  Iron level has increased from 22/140!  Patient feels much better.  She states that her periods are "not bad" and she has no desire for an IUD at the present time. Past Medical History:  Diagnosis Date   ADHD (attention deficit hyperactivity disorder)    ADHD (attention deficit hyperactivity disorder)    Elevated BP without diagnosis of hypertension 09/02/2019   Encounter for menstrual regulation 09/25/2015   GERD (gastroesophageal reflux disease)    Headache(784.0)    Insulin resistance    Obesity    Polycystic ovarian syndrome    Rash and nonspecific skin eruption 02/27/2020   Past Surgical History:  Procedure Laterality Date   Right hip     Age 62   TOOTH EXTRACTION N/A 08/12/2016   Procedure: DENTAL RESTORATION/EXTRACTIONS;  Surgeon: Ocie Doyne, DDS;  Location: [facility] OR;  Service: Oral Surgery;  Laterality: N/A;   Current Outpatient Medications on File Prior to Visit  Medication Sig Dispense Refill   albuterol (VENTOLIN HFA) 108 (90 Base) MCG/ACT inhaler Inhale 2 puffs into the lungs every 6 (six) hours as needed for wheezing  or shortness of breath. 8 g 0   amLODipine (NORVASC) 5 MG tablet Take 1 tablet (5 mg total) by mouth daily. 30 tablet 12   budesonide-formoterol (SYMBICORT) 160-4.5 MCG/ACT inhaler Inhale 2 puffs into the lungs 2 (two) times daily. 1 each 3   cetirizine (ZYRTEC) 10 MG tablet Take 1 tablet (10 mg total) by mouth daily. 30 tablet 3   Cholecalciferol 5000 units capsule Take 1 capsule (5,000 Units total) by mouth daily.     ferrous sulfate 325 (65 FE) MG tablet TAKE 1 TABLET BY MOUTH TWICE A DAY 60 tablet 1   fluticasone (FLONASE) 50 MCG/ACT nasal spray Place 2 sprays into both nostrils daily.     mometasone (ELOCON) 0.1 % cream Apply topically daily. 15 g 1    Norethindrone-Ethinyl Estradiol-Fe Biphas (LO LOESTRIN FE) 1 MG-10 MCG / 10 MCG tablet Take 1 tablet by mouth daily. 28 tablet 12   omeprazole (PRILOSEC) 20 MG capsule Take 1 capsule (20 mg total) by mouth daily. 30 capsule 3   vitamin B-12 (CYANOCOBALAMIN) 1000 MCG tablet Take 1 tablet (1,000 mcg total) by mouth daily.     No current facility-administered medications on file prior to visit.   Allergies  Allergen Reactions   No Known Allergies    Social History   Socioeconomic History   Marital status: Single    Spouse name: Not on file   Number of children: Not on file   Years of education: Not on file   Highest education level: Not on file  Occupational History   Not on file  Tobacco Use   Smoking status: Never   Smokeless tobacco: Never  Vaping Use   Vaping Use: Never used  Substance and Sexual Activity   Alcohol use: No   Drug use: No   Sexual activity: Never    Birth control/protection: Other-see comments, Pill    Comment: CELIBATE  Other Topics Concern   Not on file  Social History Narrative   Not on file   Social Determinants of Health   Financial Resource Strain: Low Risk    Difficulty of Paying Living Expenses: Not hard at all  Food Insecurity: Food Insecurity Present   Worried About Programme researcher, broadcasting/film/video in the Last Year: Sometimes true   Barista in the Last Year: Sometimes true  Transportation Needs: No Transportation Needs   Lack of Transportation (Medical): No   Lack of Transportation (Non-Medical): No  Physical Activity: Inactive   Days of Exercise per Week: 0 days   Minutes of Exercise per Session: 0 min  Stress: No Stress Concern Present   Feeling of Stress : Not at all  Social Connections: Socially Isolated   Frequency of Communication with Friends and Family: Never   Frequency of Social Gatherings with Friends and Family: Never   Attends Religious Services: Never   Database administrator or Organizations: No   Attends Museum/gallery exhibitions officer: Never   Marital Status: Never married  Catering manager Violence: Not At Risk   Fear of Current or Ex-Partner: No   Emotionally Abused: No   Physically Abused: No   Sexually Abused: No      Review of Systems  All other systems reviewed and are negative.     Objective:   Physical Exam Vitals reviewed.  Constitutional:      General: She is not in acute distress.    Appearance: She is obese. She is not ill-appearing or toxic-appearing.  Cardiovascular:  Rate and Rhythm: Normal rate and regular rhythm.     Heart sounds: No murmur heard.   No gallop.  Pulmonary:     Effort: Pulmonary effort is normal. No respiratory distress.     Breath sounds: Normal breath sounds. No stridor. No wheezing or rhonchi.  Musculoskeletal:     Right lower leg: No edema.     Left lower leg: No edema.  Neurological:     Mental Status: She is alert.          Assessment & Plan:  Iron deficiency anemia, unspecified iron deficiency anemia type - Plan: CANCELED: Vitamin B12, CANCELED: Iron, TIBC and Ferritin Panel, CANCELED: CBC with Differential/Platelet  Microcytic anemia - Plan: CANCELED: Vitamin B12, CANCELED: Iron, TIBC and Ferritin Panel, CANCELED: CBC with Differential/Platelet  Vitamin B deficiency - Plan: CANCELED: Vitamin B12, CANCELED: Iron, TIBC and Ferritin Panel, CANCELED: CBC with Differential/Platelet  Need for immunization against influenza - Plan: Flu Vaccine QUAD 59mo+IM (Fluarix, Fluzone & Alfiuria Quad PF) Patient's hemoglobin has improved back to normal baseline values along with her iron.  I believe that the anemia was secondary to iron deficiency complicated by dysfunctional uterine bleeding.  Therefore of asked the patient to continue the iron for 1 additional month and then transition to a regular women's One-A-Day multivitamin.  I would like to recheck her hemoglobin in 4 months on the multivitamin to ensure that her hemoglobin and iron levels are not  dropping off the iron therapy.  If they continue to drop, we may want to consult GYN to consider an IUD to decrease vaginal bleeding.

## 2020-12-07 ENCOUNTER — Encounter (HOSPITAL_COMMUNITY): Payer: Self-pay

## 2020-12-08 ENCOUNTER — Ambulatory Visit (HOSPITAL_COMMUNITY): Payer: Medicaid Other | Admitting: Physician Assistant

## 2020-12-21 ENCOUNTER — Ambulatory Visit (HOSPITAL_COMMUNITY): Payer: Medicaid Other | Admitting: Physician Assistant

## 2020-12-23 ENCOUNTER — Other Ambulatory Visit: Payer: Self-pay | Admitting: Family Medicine

## 2020-12-25 IMAGING — CR DG CHEST 2V
2 series · 2 of 2 positions shown · non-contrast
Comparison: April 22, 2019

CLINICAL DATA: Cough and shortness of breath

EXAM:
CHEST - 2 VIEW

[w chest pa]
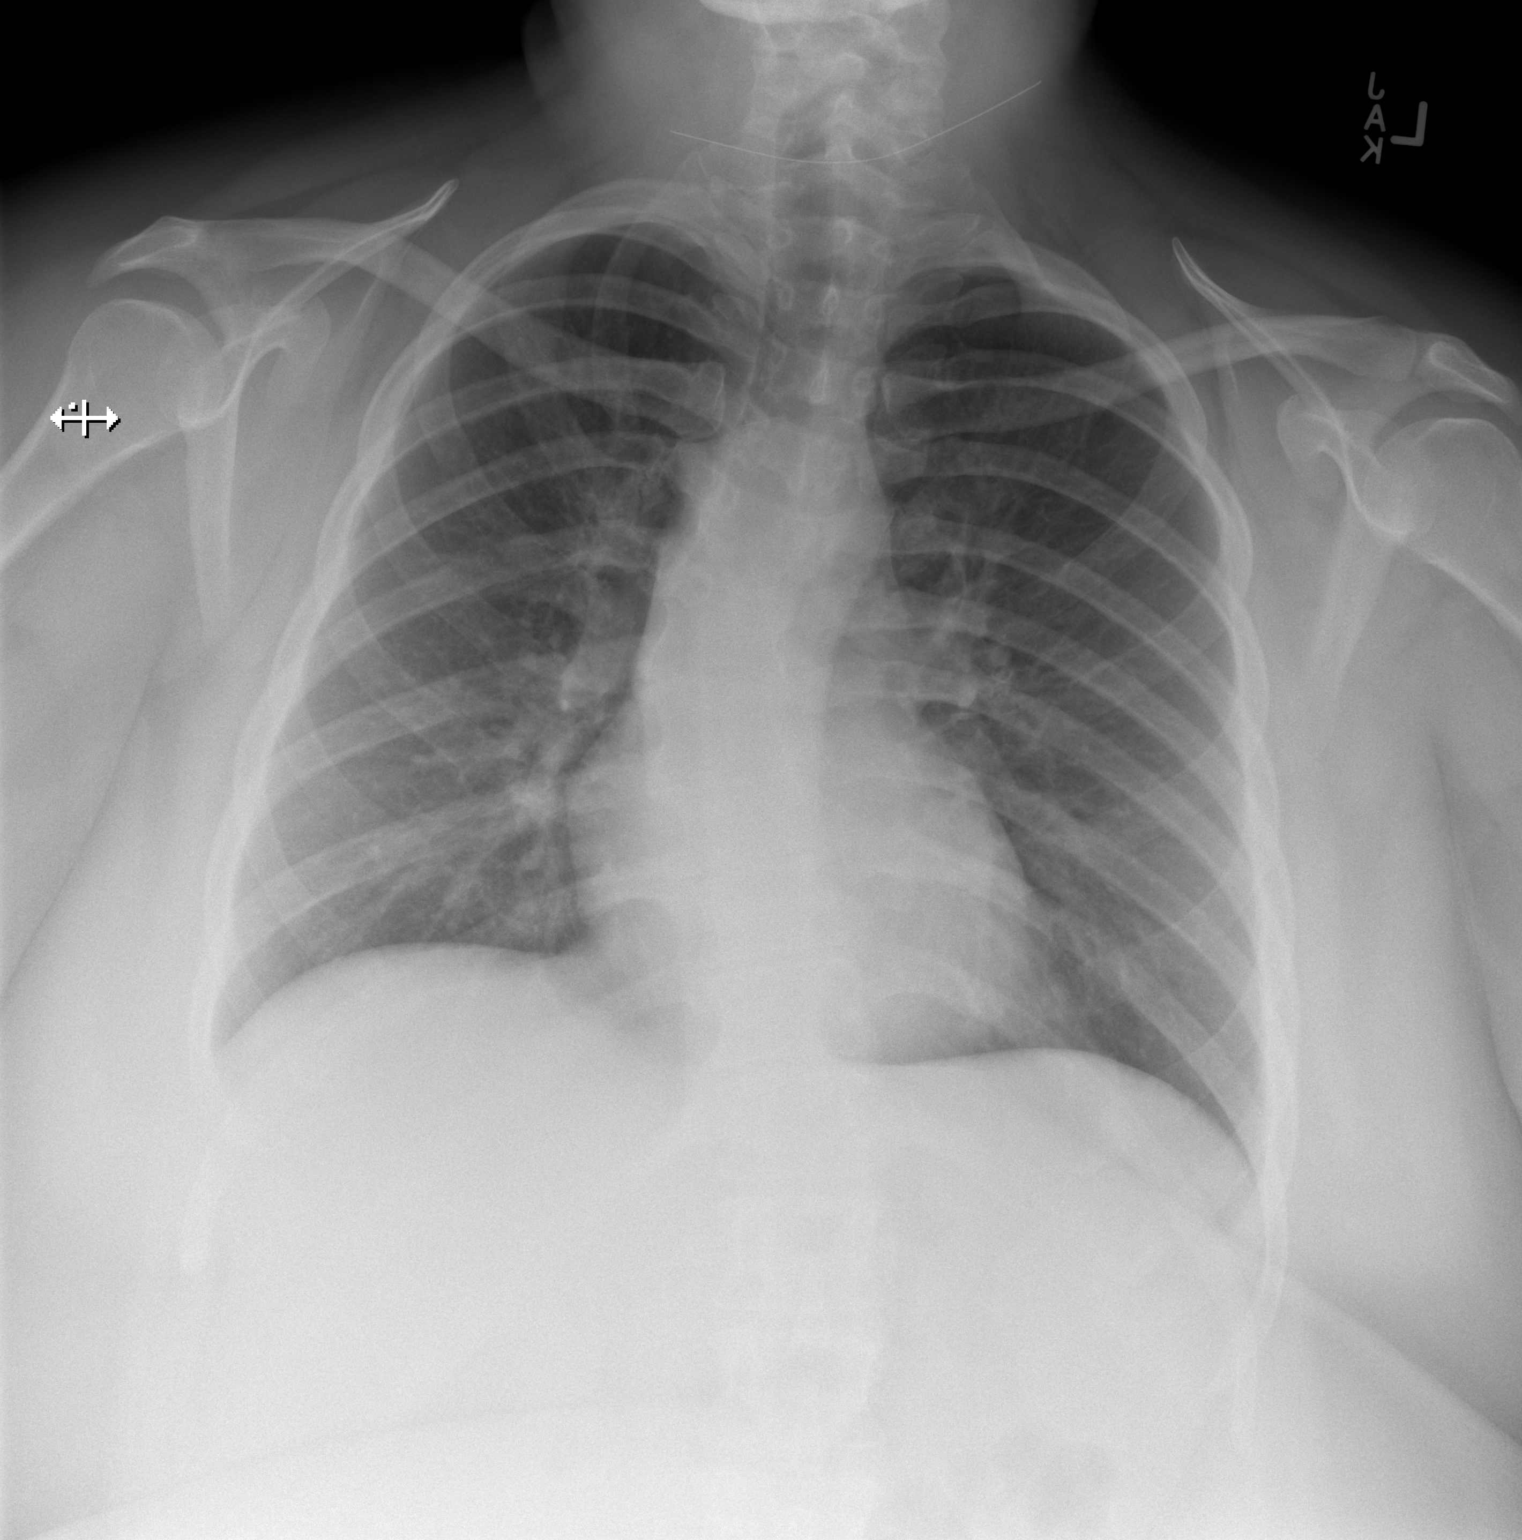

[w chest lat]
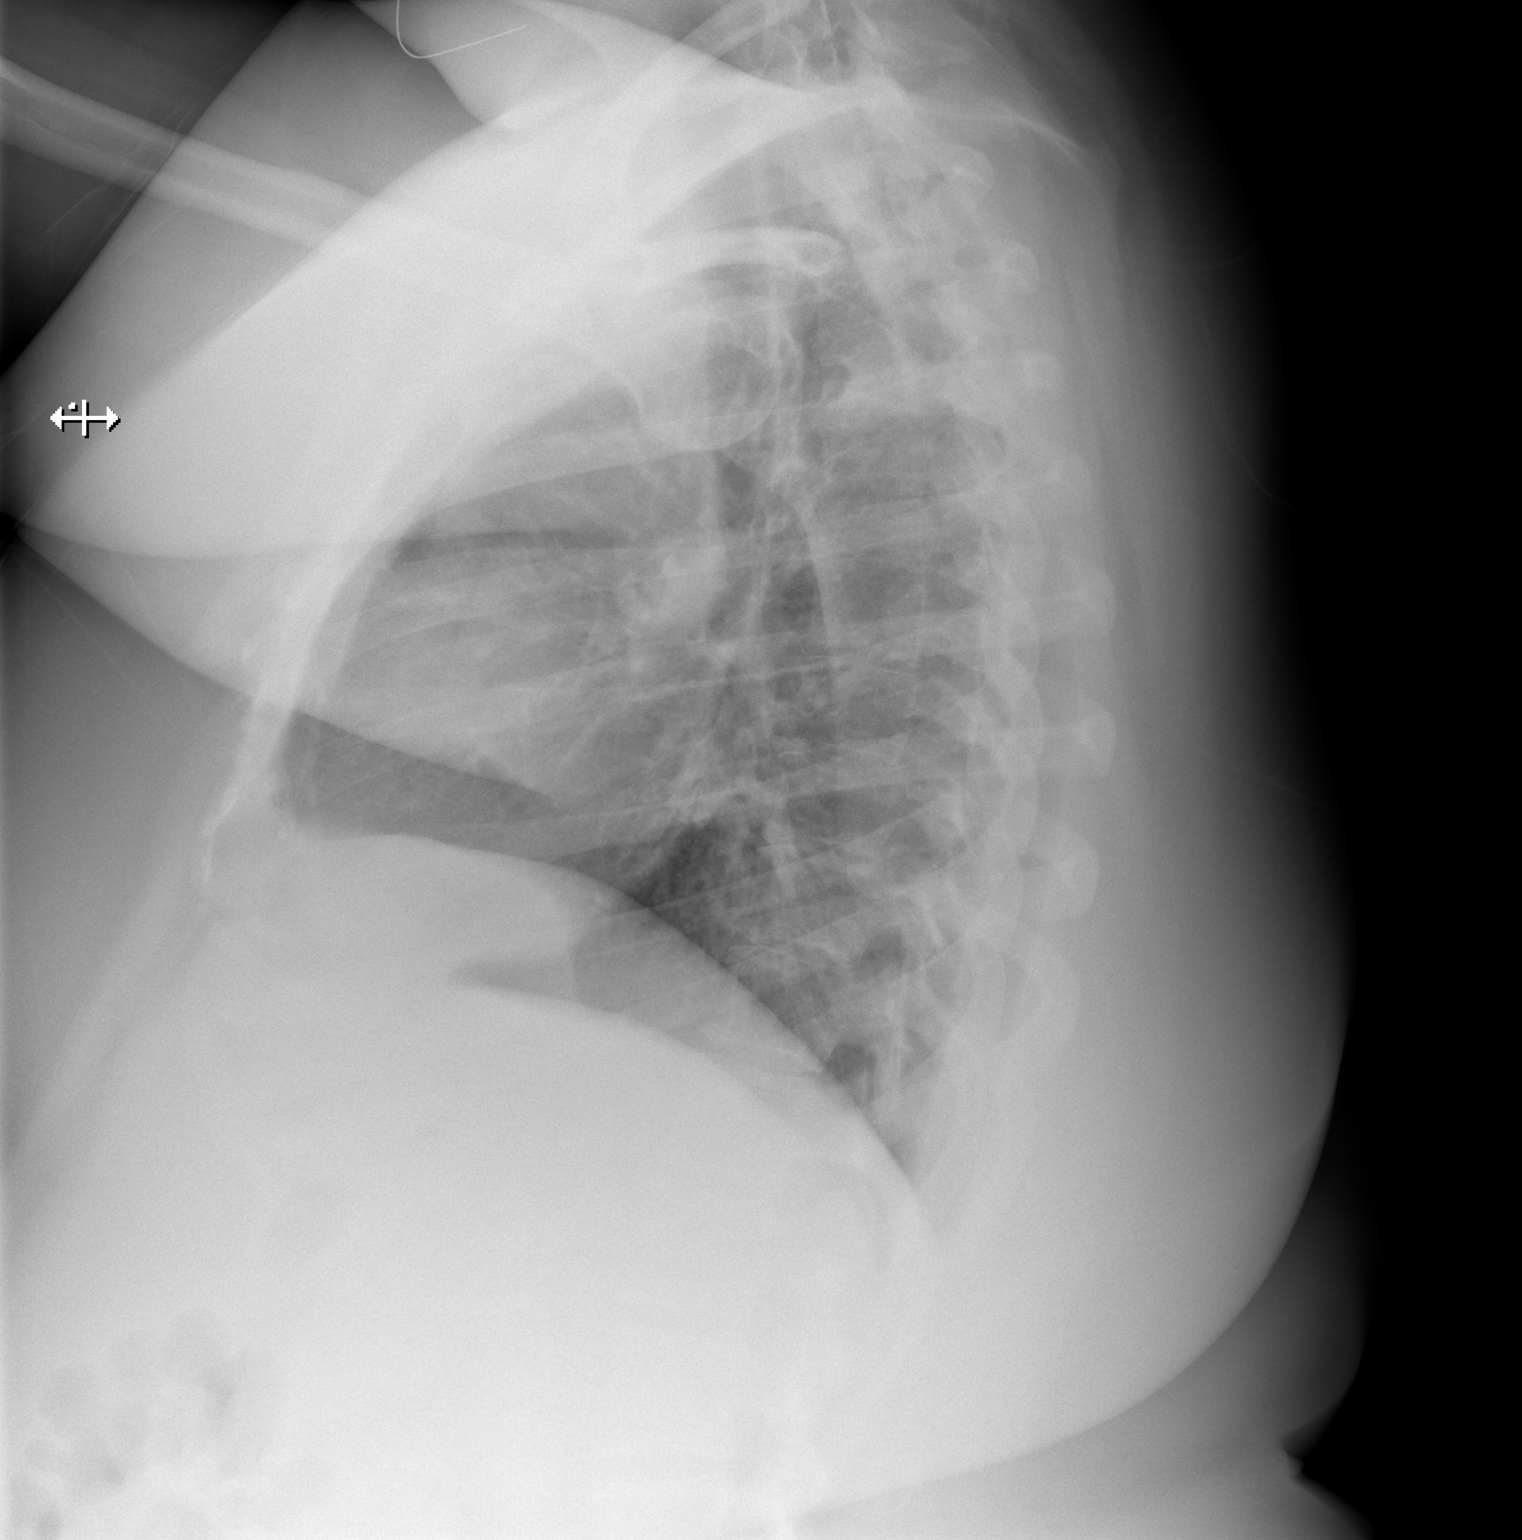

[2 of 2 positions shown; findings below may reference images not displayed]

FINDINGS: The lungs are clear. The heart size and pulmonary vascularity are
normal. No adenopathy. There is midthoracic dextroscoliosis.
IMPRESSION: Scoliosis.  Lungs clear.  Heart size normal.

## 2021-01-04 ENCOUNTER — Other Ambulatory Visit: Payer: Self-pay | Admitting: Family Medicine

## 2021-02-25 ENCOUNTER — Other Ambulatory Visit (HOSPITAL_COMMUNITY): Payer: Self-pay | Admitting: *Deleted

## 2021-02-25 DIAGNOSIS — D500 Iron deficiency anemia secondary to blood loss (chronic): Secondary | ICD-10-CM

## 2021-02-26 ENCOUNTER — Inpatient Hospital Stay (HOSPITAL_COMMUNITY): Payer: Medicaid Other | Attending: Hematology

## 2021-02-26 DIAGNOSIS — D72829 Elevated white blood cell count, unspecified: Secondary | ICD-10-CM | POA: Diagnosis not present

## 2021-02-26 DIAGNOSIS — Z79899 Other long term (current) drug therapy: Secondary | ICD-10-CM | POA: Insufficient documentation

## 2021-02-26 DIAGNOSIS — D509 Iron deficiency anemia, unspecified: Secondary | ICD-10-CM | POA: Insufficient documentation

## 2021-02-26 DIAGNOSIS — D500 Iron deficiency anemia secondary to blood loss (chronic): Secondary | ICD-10-CM

## 2021-02-26 LAB — COMPREHENSIVE METABOLIC PANEL
ALT: 18 U/L (ref 0–44)
AST: 16 U/L (ref 15–41)
Albumin: 3.5 g/dL (ref 3.5–5.0)
Alkaline Phosphatase: 105 U/L (ref 38–126)
Anion gap: 13 (ref 5–15)
BUN: 14 mg/dL (ref 6–20)
CO2: 22 mmol/L (ref 22–32)
Calcium: 8.8 mg/dL — ABNORMAL LOW (ref 8.9–10.3)
Chloride: 103 mmol/L (ref 98–111)
Creatinine, Ser: 0.59 mg/dL (ref 0.44–1.00)
GFR, Estimated: >60 mL/min (ref >60)
Glucose, Bld: 121 mg/dL — ABNORMAL HIGH (ref 70–99)
Potassium: 3.7 mmol/L (ref 3.5–5.1)
Sodium: 138 mmol/L (ref 135–145)
Total Bilirubin: 0.3 mg/dL (ref 0.3–1.2)
Total Protein: 8 g/dL (ref 6.5–8.1)

## 2021-02-26 LAB — IRON AND TIBC
Iron: 23 ug/dL — ABNORMAL LOW (ref 28–170)
Saturation Ratios: 4 % — ABNORMAL LOW (ref 10.4–31.8)
TIBC: 579 ug/dL — ABNORMAL HIGH (ref 250–450)
UIBC: 556 ug/dL

## 2021-02-26 LAB — CBC WITH DIFFERENTIAL/PLATELET
Abs Immature Granulocytes: 0.04 10*3/uL (ref 0.00–0.07)
Basophils Absolute: 0.1 10*3/uL (ref 0.0–0.1)
Basophils Relative: 1 %
Eosinophils Absolute: 0.7 10*3/uL — ABNORMAL HIGH (ref 0.0–0.5)
Eosinophils Relative: 6 %
HCT: 43.1 % (ref 36.0–46.0)
Hemoglobin: 12.9 g/dL (ref 12.0–15.0)
Immature Granulocytes: 0 %
Lymphocytes Relative: 18 %
Lymphs Abs: 2.1 10*3/uL (ref 0.7–4.0)
MCH: 22.7 pg — ABNORMAL LOW (ref 26.0–34.0)
MCHC: 29.9 g/dL — ABNORMAL LOW (ref 30.0–36.0)
MCV: 75.9 fL — ABNORMAL LOW (ref 80.0–100.0)
Monocytes Absolute: 0.6 10*3/uL (ref 0.1–1.0)
Monocytes Relative: 5 %
Neutro Abs: 8 10*3/uL — ABNORMAL HIGH (ref 1.7–7.7)
Neutrophils Relative %: 70 %
Platelets: 360 10*3/uL (ref 150–400)
RBC: 5.68 MIL/uL — ABNORMAL HIGH (ref 3.87–5.11)
RDW: 14.3 % (ref 11.5–15.5)
WBC: 11.4 10*3/uL — ABNORMAL HIGH (ref 4.0–10.5)
nRBC: 0 % (ref 0.0–0.2)

## 2021-02-26 LAB — FERRITIN: Ferritin: 5 ng/mL — ABNORMAL LOW (ref 11–307)

## 2021-03-02 NOTE — Progress Notes (Signed)
Heidi Clark, [place] [address]   CLINIC:  Medical Oncology/Hematology  PCP:  Susy Frizzle, MD [address] [place] [address] [phone]   REASON FOR VISIT:  Follow-up for iron deficiency anemia  PREVIOUS THERAPY: Daily iron supplementation, PRBC transfusion x2 (10/27/2020)   CURRENT THERAPY: Daily multivitamin  INTERVAL HISTORY:  Heidi Clark 30 y.o. female returns for routine follow-up of her iron deficiency anemia, which is thought to be secondary to heavy menstrual bleeding.  She was last seen in hematology clinic by Dr. Chryl Heck on 11/27/2020.  At today's visit, she reports feeling fair.  No recent hospitalizations, surgeries, or changes in baseline health status.  She reports that her primary care provider told her to stop taking her iron pill and to start taking daily multivitamin instead.  She reports that she has very little menstrual bleeding for the past several years, after being started on birth control pills (Lo Loestrin), although previous notes by her PCP and by Dr. Chryl Heck note that she had reported having abnormal uterine bleeding and heavy periods.  (It has been elsewhere documented the patient gives conflicting histories.)  She denies any other signs of blood loss such as epistaxis, hematemesis, hematochezia, or melena.  She denies any fatigue, pica, restless legs, headaches, chest pain, dyspnea on exertion, lightheadedness, or syncope.  She has 75% energy and 75% appetite. She endorses that she is maintaining a stable weight.   REVIEW OF SYSTEMS:  Review of Systems  Constitutional:  Positive for fatigue. Negative for appetite change, chills, diaphoresis, fever and unexpected weight change.  HENT:   Negative for lump/mass and nosebleeds.   Eyes:  Negative for eye problems.  Respiratory:  Negative for cough, hemoptysis and shortness of breath.   Cardiovascular:  Negative for chest pain, leg swelling and  palpitations.  Gastrointestinal:  Negative for abdominal pain, blood in stool, constipation, diarrhea, nausea and vomiting.  Genitourinary:  Negative for hematuria and menstrual problem.   Skin: Negative.   Neurological:  Negative for dizziness, headaches and light-headedness.  Hematological:  Does not bruise/bleed easily.     PAST MEDICAL/SURGICAL HISTORY:  Past Medical History:  Diagnosis Date   ADHD (attention deficit hyperactivity disorder)    ADHD (attention deficit hyperactivity disorder)    Elevated BP without diagnosis of hypertension 09/02/2019   Encounter for menstrual regulation 09/25/2015   GERD (gastroesophageal reflux disease)    Headache(784.0)    Insulin resistance    Obesity    Polycystic ovarian syndrome    Rash and nonspecific skin eruption 02/27/2020   Past Surgical History:  Procedure Laterality Date   Right hip     Age 46   TOOTH EXTRACTION N/A 08/12/2016   Procedure: DENTAL RESTORATION/EXTRACTIONS;  Surgeon: Diona Browner, DDS;  Location: [facility];  Service: Oral Surgery;  Laterality: N/A;     SOCIAL HISTORY:  Social History   Socioeconomic History   Marital status: Single    Spouse name: Not on file   Number of children: Not on file   Years of education: Not on file   Highest education level: Not on file  Occupational History   Not on file  Tobacco Use   Smoking status: Never   Smokeless tobacco: Never  Vaping Use   Vaping Use: Never used  Substance and Sexual Activity   Alcohol use: No   Drug use: No   Sexual activity: Never    Birth control/protection: Other-see comments, Pill  Comment: CELIBATE  Other Topics Concern   Not on file  Social History Narrative   Not on file   Social Determinants of Health   Financial Resource Strain: Low Risk    Difficulty of Paying Living Expenses: Not hard at all  Food Insecurity: Food Insecurity Present   Worried About Charity fundraiser in the Last Year: Sometimes true   Arboriculturist in the  Last Year: Sometimes true  Transportation Needs: No Transportation Needs   Lack of Transportation (Medical): No   Lack of Transportation (Non-Medical): No  Physical Activity: Inactive   Days of Exercise per Week: 0 days   Minutes of Exercise per Session: 0 min  Stress: No Stress Concern Present   Feeling of Stress : Not at all  Social Connections: Socially Isolated   Frequency of Communication with Friends and Family: Never   Frequency of Social Gatherings with Friends and Family: Never   Attends Religious Services: Never   Marine scientist or Organizations: No   Attends Music therapist: Never   Marital Status: Never married  Human resources officer Violence: Not At Risk   Fear of Current or Ex-Partner: No   Emotionally Abused: No   Physically Abused: No   Sexually Abused: No    FAMILY HISTORY:  Family History  Problem Relation Age of Onset   Diabetes Mother    Obesity Mother    Colon cancer Neg Hx     CURRENT MEDICATIONS:  Outpatient Encounter Medications as of 03/03/2021  Medication Sig   albuterol (VENTOLIN HFA) 108 (90 Base) MCG/ACT inhaler Inhale 2 puffs into the lungs every 6 (six) hours as needed for wheezing or shortness of breath.   amLODipine (NORVASC) 5 MG tablet Take 1 tablet (5 mg total) by mouth daily.   budesonide-formoterol (SYMBICORT) 160-4.5 MCG/ACT inhaler Inhale 2 puffs into the lungs 2 (two) times daily.   cetirizine (ZYRTEC) 10 MG tablet Take 1 tablet (10 mg total) by mouth daily.   Cholecalciferol 5000 units capsule Take 1 capsule (5,000 Units total) by mouth daily.   ferrous sulfate 325 (65 FE) MG tablet TAKE 1 TABLET BY MOUTH TWICE A DAY   fluticasone (FLONASE) 50 MCG/ACT nasal spray Place 2 sprays into both nostrils daily.   mometasone (ELOCON) 0.1 % cream Apply topically daily.   Norethindrone-Ethinyl Estradiol-Fe Biphas (LO LOESTRIN FE) 1 MG-10 MCG / 10 MCG tablet Take 1 tablet by mouth daily.   omeprazole (PRILOSEC) 20 MG capsule  Take 1 capsule (20 mg total) by mouth daily.   vitamin B-12 (CYANOCOBALAMIN) 1000 MCG tablet Take 1 tablet (1,000 mcg total) by mouth daily.   No facility-administered encounter medications on file as of 03/03/2021.    ALLERGIES:  Allergies  Allergen Reactions   No Known Allergies      PHYSICAL EXAM:  ECOG PERFORMANCE STATUS: 1 - Symptomatic but completely ambulatory  There were no vitals filed for this visit. There were no vitals filed for this visit. Physical Exam Constitutional:      Appearance: Normal appearance. She is morbidly obese.     Comments: Bedbugs noted on exam.  HENT:     Head: Normocephalic and atraumatic.     Mouth/Throat:     Mouth: Mucous membranes are moist.  Eyes:     Extraocular Movements: Extraocular movements intact.     Pupils: Pupils are equal, round, and reactive to light.  Cardiovascular:     Rate and Rhythm: Regular rhythm. Tachycardia present.  Pulses: Normal pulses.     Heart sounds: Normal heart sounds.  Pulmonary:     Effort: Pulmonary effort is normal.     Breath sounds: Normal breath sounds.  Abdominal:     General: Bowel sounds are normal.     Palpations: Abdomen is soft.     Tenderness: There is no abdominal tenderness.  Musculoskeletal:        General: No swelling.     Right lower leg: No edema.     Left lower leg: No edema.  Lymphadenopathy:     Cervical: No cervical adenopathy.  Skin:    General: Skin is warm and dry.  Neurological:     General: No focal deficit present.     Mental Status: She is alert and oriented to person, place, and time.  Psychiatric:        Mood and Affect: Mood normal.        Behavior: Behavior normal.     LABORATORY DATA:  I have reviewed the labs as listed.  CBC    Component Value Date/Time   WBC 11.4 (H) 02/26/2021 0915   RBC 5.68 (H) 02/26/2021 0915   HGB 12.9 02/26/2021 0915   HGB 6.3 (LL) 10/20/2020 0942   HCT 43.1 02/26/2021 0915   HCT 25.8 (L) 10/20/2020 0942   PLT 360  02/26/2021 0915   PLT 363 10/20/2020 0942   MCV 75.9 (L) 02/26/2021 0915   MCV 63 (L) 10/20/2020 0942   MCH 22.7 (L) 02/26/2021 0915   MCHC 29.9 (L) 02/26/2021 0915   RDW 14.3 02/26/2021 0915   RDW 20.5 (H) 10/20/2020 0942   LYMPHSABS 2.1 02/26/2021 0915   MONOABS 0.6 02/26/2021 0915   EOSABS 0.7 (H) 02/26/2021 0915   BASOSABS 0.1 02/26/2021 0915   CMP Latest Ref Rng & Units 02/26/2021 11/27/2020 10/27/2020  Glucose 70 - 99 mg/dL 518(A) 416(S) 063(K)  BUN 6 - 20 mg/dL 14 14 14   Creatinine 0.44 - 1.00 mg/dL 1.60 1.09  Sodium 135 - 145 mmol/L 138 138 139  Potassium 3.5 - 5.1 mmol/L 3.7 4.2 3.7  Chloride 98 - 111 mmol/L 103 105 108  CO2 22 - 32 mmol/L 22 20(L) 22  Calcium 8.9 - 10.3 mg/dL 3.23) 9.4 8.9  Total Protein 6.5 - 8.1 g/dL 8.0 5.5(D) 8.0  Total Bilirubin 0.3 - 1.2 mg/dL 0.3 0.4 0.4  Alkaline Phos 38 - 126 U/L 105 112 93  AST 15 - 41 U/L 16 19 16   ALT 0 - 44 U/L 18 16 11     DIAGNOSTIC IMAGING:  I have independently reviewed the relevant imaging and discussed with the patient.  ASSESSMENT & PLAN: 1.  Iron deficiency anemia - Iron deficiency anemia likely secondary to menstrual bleeding, although patient reports that her periods are lighter now that she is taking Lo Loestrin.  She continues to follow with GYN for management of her menstrual bleeding. - Severe anemia noted on 10/27/2020 with Hgb 6.7, received 2 units PRBC blood transfusion. - She is currently taking daily multivitamin, since her PCP told her to stop taking oral iron supplementation.  She has not received any IV iron infusions in the past. - She denies any abnormal blood loss such as bright red blood per rectum or melena - Hemoccult negative in August 2022 -She has mild fatigue with energy about 75%. - Most recent labs (02/26/2021): Hgb 12.9, MCV 75.9, RBC 5.68.  Mild leukocytosis noted with WBC 11.4/ANC 8.0.  CMP essentially unremarkable.  Iron panel  showed iron saturation 4%, elevated TIBC 579, and  ferritin 5. - Suspect iron deficiency anemia secondary to menstrual blood loss, but patient may also have an element of malabsorption of iron. - PLAN: Recommend IV iron with Venofer 300 mg x 3. - We discussed the main side effects and risks of IV iron, such as body aches, back pain, flulike symptoms, and hypersensitivity reaction.  Patient agrees to proceed with treatment. - Patient should restart oral iron supplementation at home and continue unless she is having any significant side effects.   - Repeat labs and RTC in 4 months - If she has any significant drops in hemoglobin in the future, would consider work-up for any occult GI bleeding, hemolysis, or other acute anemia.   PLAN SUMMARY & DISPOSITION: IV Venofer 300 mg x 3 Labs and RTC in 4 months  All questions were answered. The patient knows to call the clinic with any problems, questions or concerns.  Medical decision making: Low  Time spent on visit: I spent 15 minutes counseling the patient face to face. The total time spent in the appointment was 25 minutes and more than 50% was on counseling.   Harriett Rush, PA-C  03/03/2021 6:02 PM

## 2021-03-03 ENCOUNTER — Inpatient Hospital Stay (HOSPITAL_COMMUNITY): Payer: Medicaid Other | Admitting: Physician Assistant

## 2021-03-03 ENCOUNTER — Other Ambulatory Visit: Payer: Self-pay

## 2021-03-03 VITALS — BP 133/86 | HR 110 | Temp 99.1°F | Resp 18 | Wt 278.2 lb

## 2021-03-03 DIAGNOSIS — D500 Iron deficiency anemia secondary to blood loss (chronic): Secondary | ICD-10-CM | POA: Diagnosis not present

## 2021-03-03 DIAGNOSIS — D509 Iron deficiency anemia, unspecified: Secondary | ICD-10-CM | POA: Diagnosis not present

## 2021-03-03 DIAGNOSIS — D72829 Elevated white blood cell count, unspecified: Secondary | ICD-10-CM | POA: Diagnosis not present

## 2021-03-03 DIAGNOSIS — Z79899 Other long term (current) drug therapy: Secondary | ICD-10-CM | POA: Diagnosis not present

## 2021-03-03 NOTE — Patient Instructions (Signed)
[facility] at [facility] Discharge Instructions  You were seen today by Rojelio Brenner PA-C for your iron deficiency anemia.  Your iron levels are very low at this visit, so we will schedule you for IV iron infusions.  Additionally, you should start taking your iron pill once a day.  If this causes any constipation, you may want to take a stool softener as well.  LABS: Return in 3 months for repeat labs  OTHER TESTS: No other tests at this time  MEDICATIONS: Restart iron pill  FOLLOW-UP APPOINTMENT: Office visit in 3 months, after labs   Thank you for choosing [facility] at [facility] to provide your oncology and hematology care.  To afford each patient quality time with our provider, please arrive at least 15 minutes before your scheduled appointment time.   If you have a lab appointment with the Cancer Center please come in thru the Main Entrance and check in at the main information desk.  You need to re-schedule your appointment should you arrive 10 or more minutes late.  We strive to give you quality time with our providers, and arriving late affects you and other patients whose appointments are after yours.  Also, if you no show three or more times for appointments you may be dismissed from the clinic at the providers discretion.     Again, thank you for choosing [facility].  Our hope is that these requests will decrease the amount of time that you wait before being seen by our physicians.       _____________________________________________________________  Should you have questions after your visit to [facility], please contact our office at [phone] and follow the prompts.  Our office hours are 8:00 a.m. and 4:30 p.m. Monday - Friday.  Please note that voicemails left after 4:00 p.m. may not be returned until the following business day.  We are closed weekends and major holidays.  You do have access to  a nurse 24-7, just call the main number to the clinic [phone] and do not press any options, hold on the line and a nurse will answer the phone.    For prescription refill requests, have your pharmacy contact our office and allow 72 hours.    Due to Covid, you will need to wear a mask upon entering the hospital. If you do not have a mask, a mask will be given to you at the Main Entrance upon arrival. For doctor visits, patients may have 1 support person age 68 or older with them. For treatment visits, patients can not have anyone with them due to social distancing guidelines and our immunocompromised population.

## 2021-03-10 ENCOUNTER — Ambulatory Visit (HOSPITAL_COMMUNITY): Payer: Medicaid Other

## 2021-03-16 ENCOUNTER — Ambulatory Visit (HOSPITAL_COMMUNITY): Payer: Medicaid Other

## 2021-03-19 ENCOUNTER — Inpatient Hospital Stay (HOSPITAL_COMMUNITY): Payer: Medicaid Other | Attending: Hematology

## 2021-03-19 ENCOUNTER — Other Ambulatory Visit: Payer: Self-pay

## 2021-03-19 VITALS — BP 122/90 | HR 98 | Temp 98.6°F | Resp 20

## 2021-03-19 DIAGNOSIS — D500 Iron deficiency anemia secondary to blood loss (chronic): Secondary | ICD-10-CM | POA: Diagnosis present

## 2021-03-19 DIAGNOSIS — R000 Tachycardia, unspecified: Secondary | ICD-10-CM | POA: Diagnosis not present

## 2021-03-19 DIAGNOSIS — Z8349 Family history of other endocrine, nutritional and metabolic diseases: Secondary | ICD-10-CM | POA: Diagnosis not present

## 2021-03-19 DIAGNOSIS — Z833 Family history of diabetes mellitus: Secondary | ICD-10-CM | POA: Insufficient documentation

## 2021-03-19 DIAGNOSIS — Z79899 Other long term (current) drug therapy: Secondary | ICD-10-CM | POA: Insufficient documentation

## 2021-03-19 DIAGNOSIS — R5383 Other fatigue: Secondary | ICD-10-CM | POA: Insufficient documentation

## 2021-03-19 DIAGNOSIS — N920 Excessive and frequent menstruation with regular cycle: Secondary | ICD-10-CM | POA: Insufficient documentation

## 2021-03-19 MED ORDER — SODIUM CHLORIDE 0.9 % IV SOLN: Freq: Once | INTRAVENOUS | Status: AC

## 2021-03-19 MED ORDER — SODIUM CHLORIDE 0.9 % IV SOLN
300.0000 mg | Freq: Once | INTRAVENOUS | Status: AC
  Administered 2021-03-19: 300 mg via INTRAVENOUS
  Filled 2021-03-19: qty 200

## 2021-03-19 MED ORDER — ACETAMINOPHEN 325 MG PO TABS
650.0000 mg | ORAL_TABLET | Freq: Once | ORAL | Status: AC
  Administered 2021-03-19: 650 mg via ORAL
  Filled 2021-03-19: qty 2

## 2021-03-19 MED ORDER — LORATADINE 10 MG PO TABS
10.0000 mg | ORAL_TABLET | Freq: Once | ORAL | Status: AC
  Administered 2021-03-19: 10 mg via ORAL
  Filled 2021-03-19: qty 1

## 2021-03-19 NOTE — Patient Instructions (Signed)
[facility]  Discharge Instructions: Thank you for choosing [facility] to provide your oncology and hematology care.  If you have a lab appointment with the Cancer Center, please come in thru the Main Entrance and check in at the main information desk.  Wear comfortable clothing and clothing appropriate for easy access to any Portacath or PICC line.   We strive to give you quality time with your provider. You may need to reschedule your appointment if you arrive late (15 or more minutes).  Arriving late affects you and other patients whose appointments are after yours.  Also, if you miss three or more appointments without notifying the office, you may be dismissed from the clinic at the provider's discretion.      For prescription refill requests, have your pharmacy contact our office and allow 72 hours for refills to be completed.    Today you received the following chemotherapy and/or immunotherapy agents Venofer      To help prevent nausea and vomiting after your treatment, we encourage you to take your nausea medication as directed.  BELOW ARE SYMPTOMS THAT SHOULD BE REPORTED IMMEDIATELY: *FEVER GREATER THAN 100.4 F (38 C) OR HIGHER *CHILLS OR SWEATING *NAUSEA AND VOMITING THAT IS NOT CONTROLLED WITH YOUR NAUSEA MEDICATION *UNUSUAL SHORTNESS OF BREATH *UNUSUAL BRUISING OR BLEEDING *URINARY PROBLEMS (pain or burning when urinating, or frequent urination) *BOWEL PROBLEMS (unusual diarrhea, constipation, pain near the anus) TENDERNESS IN MOUTH AND THROAT WITH OR WITHOUT PRESENCE OF ULCERS (sore throat, sores in mouth, or a toothache) UNUSUAL RASH, SWELLING OR PAIN  UNUSUAL VAGINAL DISCHARGE OR ITCHING   Items with * indicate a potential emergency and should be followed up as soon as possible or go to the Emergency Department if any problems should occur.  Please show the CHEMOTHERAPY ALERT CARD or IMMUNOTHERAPY ALERT CARD at check-in to the Emergency  Department and triage nurse.  Should you have questions after your visit or need to cancel or reschedule your appointment, please contact [facility] [phone]  and follow the prompts.  Office hours are 8:00 a.m. to 4:30 p.m. Monday - Friday. Please note that voicemails left after 4:00 p.m. may not be returned until the following business day.  We are closed weekends and major holidays. You have access to a nurse at all times for urgent questions. Please call the main number to the clinic [phone] and follow the prompts.  For any non-urgent questions, you may also contact your provider using MyChart. We now offer e-Visits for anyone 18 and older to request care online for non-urgent symptoms. For details visit [email or web address].   Also download the MyChart app! Go to the app store, search "MyChart", open the app, select [facility], and log in with your MyChart username and password.  Due to Covid, a mask is required upon entering the hospital/clinic. If you do not have a mask, one will be given to you upon arrival. For doctor visits, patients may have 1 support person aged 18 or older with them. For treatment visits, patients cannot have anyone with them due to current Covid guidelines and our immunocompromised population.  

## 2021-03-19 NOTE — Progress Notes (Signed)
Patient presents today for Venofer infusion per providers order.  Vital signs WNL.  Patient has no new complaints at this time.  Peripheral IV started and blood return noted pre and post infusion.  Venofer infusion given today per MD orders.  Stable during infusion without adverse affects.  Vital signs stable.  No complaints at this time.  Discharge from clinic ambulatory in stable condition.  Alert and oriented X 3.  Follow up with [facility] as scheduled.  

## 2021-03-30 ENCOUNTER — Other Ambulatory Visit: Payer: Self-pay

## 2021-03-30 ENCOUNTER — Inpatient Hospital Stay (HOSPITAL_COMMUNITY): Payer: Medicaid Other

## 2021-03-30 ENCOUNTER — Encounter (HOSPITAL_COMMUNITY): Payer: Self-pay

## 2021-03-30 VITALS — BP 115/83 | HR 93 | Temp 97.5°F | Resp 18

## 2021-03-30 DIAGNOSIS — D500 Iron deficiency anemia secondary to blood loss (chronic): Secondary | ICD-10-CM | POA: Diagnosis not present

## 2021-03-30 MED ORDER — SODIUM CHLORIDE 0.9 % IV SOLN: Freq: Once | INTRAVENOUS | Status: AC

## 2021-03-30 MED ORDER — LORATADINE 10 MG PO TABS
10.0000 mg | ORAL_TABLET | Freq: Once | ORAL | Status: AC
  Administered 2021-03-30: 10 mg via ORAL
  Filled 2021-03-30: qty 1

## 2021-03-30 MED ORDER — SODIUM CHLORIDE 0.9 % IV SOLN
300.0000 mg | Freq: Once | INTRAVENOUS | Status: AC
  Administered 2021-03-30: 300 mg via INTRAVENOUS
  Filled 2021-03-30: qty 300

## 2021-03-30 MED ORDER — ACETAMINOPHEN 325 MG PO TABS
650.0000 mg | ORAL_TABLET | Freq: Once | ORAL | Status: AC
  Administered 2021-03-30: 650 mg via ORAL
  Filled 2021-03-30: qty 2

## 2021-03-30 MED ORDER — SODIUM CHLORIDE 0.9% FLUSH
10.0000 mL | Freq: Once | INTRAVENOUS | Status: AC | PRN
  Administered 2021-03-30: 10 mL

## 2021-03-30 NOTE — Patient Instructions (Signed)
[facility]  Discharge Instructions: Thank you for choosing [facility] to provide your oncology and hematology care.  If you have a lab appointment with the Cancer Center, please come in thru the Main Entrance and check in at the main information desk.  Wear comfortable clothing and clothing appropriate for easy access to any Portacath or PICC line.   We strive to give you quality time with your provider. You may need to reschedule your appointment if you arrive late (15 or more minutes).  Arriving late affects you and other patients whose appointments are after yours.  Also, if you miss three or more appointments without notifying the office, you may be dismissed from the clinic at the provider's discretion.      For prescription refill requests, have your pharmacy contact our office and allow 72 hours for refills to be completed.        To help prevent nausea and vomiting after your treatment, we encourage you to take your nausea medication as directed.  BELOW ARE SYMPTOMS THAT SHOULD BE REPORTED IMMEDIATELY: *FEVER GREATER THAN 100.4 F (38 C) OR HIGHER *CHILLS OR SWEATING *NAUSEA AND VOMITING THAT IS NOT CONTROLLED WITH YOUR NAUSEA MEDICATION *UNUSUAL SHORTNESS OF BREATH *UNUSUAL BRUISING OR BLEEDING *URINARY PROBLEMS (pain or burning when urinating, or frequent urination) *BOWEL PROBLEMS (unusual diarrhea, constipation, pain near the anus) TENDERNESS IN MOUTH AND THROAT WITH OR WITHOUT PRESENCE OF ULCERS (sore throat, sores in mouth, or a toothache) UNUSUAL RASH, SWELLING OR PAIN  UNUSUAL VAGINAL DISCHARGE OR ITCHING   Items with * indicate a potential emergency and should be followed up as soon as possible or go to the Emergency Department if any problems should occur.  Please show the CHEMOTHERAPY ALERT CARD or IMMUNOTHERAPY ALERT CARD at check-in to the Emergency Department and triage nurse.  Should you have questions after your visit or need to cancel  or reschedule your appointment, please contact  CANCER CENTER [phone]  and follow the prompts.  Office hours are 8:00 a.m. to 4:30 p.m. Monday - Friday. Please note that voicemails left after 4:00 p.m. may not be returned until the following business day.  We are closed weekends and major holidays. You have access to a nurse at all times for urgent questions. Please call the main number to the clinic [phone] and follow the prompts.  For any non-urgent questions, you may also contact your provider using MyChart. We now offer e-Visits for anyone 18 and older to request care online for non-urgent symptoms. For details visit [email or web address].   Also download the MyChart app! Go to the app store, search "MyChart", open the app, select [facility], and log in with your MyChart username and password.  Due to Covid, a mask is required upon entering the hospital/clinic. If you do not have a mask, one will be given to you upon arrival. For doctor visits, patients may have 1 support person aged 18 or older with them. For treatment visits, patients cannot have anyone with them due to current Covid guidelines and our immunocompromised population.  

## 2021-03-30 NOTE — Progress Notes (Signed)
Patient tolerated iron infusion with no complaints voiced.  Peripheral IV site clean and dry with good blood return noted before and after infusion.  Band aid applied.  VSS with discharge and left in satisfactory condition with no s/s of distress noted.   

## 2021-04-02 ENCOUNTER — Encounter: Payer: Self-pay | Admitting: Family Medicine

## 2021-04-02 ENCOUNTER — Other Ambulatory Visit: Payer: Self-pay

## 2021-04-02 ENCOUNTER — Ambulatory Visit: Payer: Medicaid Other | Admitting: Family Medicine

## 2021-04-02 VITALS — BP 128/78 | HR 103 | Temp 97.9°F | Resp 18 | Ht 61.0 in | Wt 278.0 lb

## 2021-04-02 DIAGNOSIS — D509 Iron deficiency anemia, unspecified: Secondary | ICD-10-CM | POA: Diagnosis not present

## 2021-04-02 NOTE — Progress Notes (Signed)
Subjective:    Patient ID: Heidi Clark, female    DOB: 08-Jun-1990, 31 y.o.   MRN: [id number]  HPI  10/29/20 Patient is a 31 year old Caucasian female with a history of morbid obesity, insulin resistant, and polycystic ovary syndrome.  She was recently seen by her gynecologist and found to have a hemoglobin of 6.3.  It was a microcytic anemia.  Repeat hemoglobin had dropped to 6.1.  She was referred to the emergency room where she received a transfusion.  She is here today for follow-up.  She is not on any iron.  She is on birth control pills and she states that she has a period that last about 5 days.  She gives me conflicting information.  At times she states that the periods are heavy despite taking the birth control pills.  At other times she states that its not heavy anymore.  She denies any rectal bleeding or melena.  She also has a rash on the dorsum of her right forearm.  Is been there for several months.  For the last month she has been putting a "cream" on it although she is not sure the name or type of cream.  Her father has severe eczema.  She also has eczema-like skin changes seen on both arms.  This is approximately 6 cm x 4.5 cm.  Is a large scab with lichenification and a surrounding papulosquamous eruption.  There is no erythema or warmth to suggest infection.  At that time, my plan was: Patient has microcytic anemia.  I suspect is due to iron deficiency coupled with dysfunctional uterine bleeding.  I will start the patient on ferrous sulfate 325 mg twice daily and check an iron level today along with a B12 level.  Repeat iron level and CBC in 1 month to see if this is improving.  She already has an appointment per the hospital records to see hematologist.  If she is absorbing iron and its not improving she may need to consider an IUD to better manage her vaginal bleeding.  I believe the rash on her forearm is eczema.  Try Elocon cream once daily and keep the area covered to avoid  scratching and picking at the wound.  Recheck in 1 month  12/01/20 Patient saw hematologist yesterday.  I reviewed the lab work from that appointment.  Hemoglobin is now 12.9 up from 6.7!  Iron level has increased from 22/140!  Patient feels much better.  She states that her periods are "not bad" and she has no desire for an IUD at the present time.  AT that time, my plan was: Patient's hemoglobin has improved back to normal baseline values along with her iron.  I believe that the anemia was secondary to iron deficiency complicated by dysfunctional uterine bleeding.  Therefore of asked the patient to continue the iron for 1 additional month and then transition to a regular women's One-A-Day multivitamin.  I would like to recheck her hemoglobin in 4 months on the multivitamin to ensure that her hemoglobin and iron levels are not dropping off the iron therapy.  If they continue to drop, we may want to consult GYN to consider an IUD to decrease vaginal bleeding.  04/02/21 I reviewed lab work that she had December 16.  Iron levels dropped to curiously after she stopped the iron supplement proving that she needs to continue oral iron indefinitely.  Oncology went ahead and restarted the iron supplement.  She is taking 1 a day.  She still on her birth control pills.  She states that she has not had a period every month.  When she does have a period it is very light.  She denies any heavy vaginal bleeding.  She denies any shortness of breath.  She denies any lightheadedness.  She denies any chest pain.  She denies any fatigue.  Hemoglobin was stable in December although the iron had dropped precipitously from 147 down to 23.   Past Medical History:  Diagnosis Date   ADHD (attention deficit hyperactivity disorder)    ADHD (attention deficit hyperactivity disorder)    Elevated BP without diagnosis of hypertension 09/02/2019   Encounter for menstrual regulation 09/25/2015   GERD (gastroesophageal reflux disease)     Headache(784.0)    Insulin resistance    Obesity    Polycystic ovarian syndrome    Rash and nonspecific skin eruption 02/27/2020   Past Surgical History:  Procedure Laterality Date   Right hip     Age 31   TOOTH EXTRACTION N/A 08/12/2016   Procedure: DENTAL RESTORATION/EXTRACTIONS;  Surgeon: Diona Browner, DDS;  Location: [place];  Service: Oral Surgery;  Laterality: N/A;   Current Outpatient Medications on File Prior to Visit  Medication Sig Dispense Refill   albuterol (VENTOLIN HFA) 108 (90 Base) MCG/ACT inhaler Inhale 2 puffs into the lungs every 6 (six) hours as needed for wheezing or shortness of breath. 8 g 0   amLODipine (NORVASC) 5 MG tablet Take 1 tablet (5 mg total) by mouth daily. 30 tablet 12   budesonide-formoterol (SYMBICORT) 160-4.5 MCG/ACT inhaler Inhale 2 puffs into the lungs 2 (two) times daily. 1 each 3   cetirizine (ZYRTEC) 10 MG tablet Take 1 tablet (10 mg total) by mouth daily. 30 tablet 3   Cholecalciferol 5000 units capsule Take 1 capsule (5,000 Units total) by mouth daily.     ferrous sulfate 325 (65 FE) MG tablet TAKE 1 TABLET BY MOUTH TWICE A DAY 180 tablet 1   fluticasone (FLONASE) 50 MCG/ACT nasal spray Place 2 sprays into both nostrils daily.     mometasone (ELOCON) 0.1 % cream Apply topically daily. 15 g 1   Norethindrone-Ethinyl Estradiol-Fe Biphas (LO LOESTRIN FE) 1 MG-10 MCG / 10 MCG tablet Take 1 tablet by mouth daily. 28 tablet 12   omeprazole (PRILOSEC) 20 MG capsule Take 1 capsule (20 mg total) by mouth daily. 30 capsule 3   vitamin B-12 (CYANOCOBALAMIN) 1000 MCG tablet Take 1 tablet (1,000 mcg total) by mouth daily.     No current facility-administered medications on file prior to visit.   Allergies  Allergen Reactions   No Known Allergies    Social History   Socioeconomic History   Marital status: Single    Spouse name: Not on file   Number of children: Not on file   Years of education: Not on file   Highest education level: Not on file   Occupational History   Not on file  Tobacco Use   Smoking status: Never   Smokeless tobacco: Never  Vaping Use   Vaping Use: Never used  Substance and Sexual Activity   Alcohol use: No   Drug use: No   Sexual activity: Never    Birth control/protection: Other-see comments, Pill    Comment: CELIBATE  Other Topics Concern   Not on file  Social History Narrative   Not on file   Social Determinants of Health   Financial Resource Strain: Low Risk    Difficulty of Paying  Living Expenses: Not hard at all  Food Insecurity: Food Insecurity Present   Worried About Charity fundraiser in the Last Year: Sometimes true   Ran Out of Food in the Last Year: Sometimes true  Transportation Needs: No Transportation Needs   Lack of Transportation (Medical): No   Lack of Transportation (Non-Medical): No  Physical Activity: Inactive   Days of Exercise per Week: 0 days   Minutes of Exercise per Session: 0 min  Stress: No Stress Concern Present   Feeling of Stress : Not at all  Social Connections: Socially Isolated   Frequency of Communication with Friends and Family: Never   Frequency of Social Gatherings with Friends and Family: Never   Attends Religious Services: Never   Marine scientist or Organizations: No   Attends Music therapist: Never   Marital Status: Never married  Human resources officer Violence: Not At Risk   Fear of Current or Ex-Partner: No   Emotionally Abused: No   Physically Abused: No   Sexually Abused: No      Review of Systems  All other systems reviewed and are negative.     Objective:   Physical Exam Vitals reviewed.  Constitutional:      General: She is not in acute distress.    Appearance: She is obese. She is not ill-appearing or toxic-appearing.  Cardiovascular:     Rate and Rhythm: Normal rate and regular rhythm.     Heart sounds: No murmur heard.   No gallop.  Pulmonary:     Effort: Pulmonary effort is normal. No respiratory  distress.     Breath sounds: Normal breath sounds. No stridor. No wheezing or rhonchi.  Musculoskeletal:     Right lower leg: No edema.     Left lower leg: No edema.  Neurological:     Mental Status: She is alert.          Assessment & Plan:  Iron deficiency anemia, unspecified iron deficiency anemia type - Plan: Iron, CBC with Differential/Platelet Continue oral iron supplementation indefinitely.  I will not repeat CBC or iron levels today as this was just checked less than 1 month ago.  She is back on an iron pill.  Oncology plans to recheck her labs in 4 months therefore I will defer to them to check her hemoglobin and iron again.  Biggest issue at this point is insulin resistance and her morbid obesity.  Continue to encourage low-carb diet, exercise, and weight loss

## 2021-04-06 ENCOUNTER — Ambulatory Visit (HOSPITAL_COMMUNITY): Payer: Medicaid Other

## 2021-04-07 ENCOUNTER — Other Ambulatory Visit: Payer: Self-pay | Admitting: Nurse Practitioner

## 2021-04-07 DIAGNOSIS — R053 Chronic cough: Secondary | ICD-10-CM

## 2021-04-09 ENCOUNTER — Ambulatory Visit (HOSPITAL_COMMUNITY): Payer: Medicaid Other

## 2021-04-23 ENCOUNTER — Other Ambulatory Visit: Payer: Self-pay

## 2021-04-23 ENCOUNTER — Inpatient Hospital Stay (HOSPITAL_COMMUNITY): Payer: Medicaid Other | Attending: Hematology

## 2021-04-23 VITALS — BP 120/83 | HR 92 | Temp 98.7°F | Resp 16

## 2021-04-23 DIAGNOSIS — D508 Other iron deficiency anemias: Secondary | ICD-10-CM | POA: Diagnosis not present

## 2021-04-23 DIAGNOSIS — D500 Iron deficiency anemia secondary to blood loss (chronic): Secondary | ICD-10-CM

## 2021-04-23 MED ORDER — ACETAMINOPHEN 325 MG PO TABS
650.0000 mg | ORAL_TABLET | Freq: Once | ORAL | Status: AC
  Administered 2021-04-23: 650 mg via ORAL
  Filled 2021-04-23: qty 2

## 2021-04-23 MED ORDER — SODIUM CHLORIDE 0.9 % IV SOLN: Freq: Once | INTRAVENOUS | Status: AC

## 2021-04-23 MED ORDER — LORATADINE 10 MG PO TABS
10.0000 mg | ORAL_TABLET | Freq: Once | ORAL | Status: AC
  Administered 2021-04-23: 10 mg via ORAL
  Filled 2021-04-23: qty 1

## 2021-04-23 MED ORDER — SODIUM CHLORIDE 0.9 % IV SOLN
300.0000 mg | Freq: Once | INTRAVENOUS | Status: AC
  Administered 2021-04-23: 300 mg via INTRAVENOUS
  Filled 2021-04-23: qty 300

## 2021-04-23 NOTE — Progress Notes (Signed)
Patient tolerated iron infusion with no complaints voiced.  Peripheral IV site clean and dry with good blood return noted before and after infusion.  Band aid applied.  VSS with discharge and left in satisfactory condition with no s/s of distress noted.   

## 2021-04-23 NOTE — Patient Instructions (Signed)
[facility]  Discharge Instructions: Thank you for choosing [facility] to provide your oncology and hematology care.  If you have a lab appointment with the Cancer Center, please come in thru the Main Entrance and check in at the main information desk.  Wear comfortable clothing and clothing appropriate for easy access to any Portacath or PICC line.   We strive to give you quality time with your provider. You may need to reschedule your appointment if you arrive late (15 or more minutes).  Arriving late affects you and other patients whose appointments are after yours.  Also, if you miss three or more appointments without notifying the office, you may be dismissed from the clinic at the provider's discretion.      For prescription refill requests, have your pharmacy contact our office and allow 72 hours for refills to be completed.        To help prevent nausea and vomiting after your treatment, we encourage you to take your nausea medication as directed.  BELOW ARE SYMPTOMS THAT SHOULD BE REPORTED IMMEDIATELY: *FEVER GREATER THAN 100.4 F (38 C) OR HIGHER *CHILLS OR SWEATING *NAUSEA AND VOMITING THAT IS NOT CONTROLLED WITH YOUR NAUSEA MEDICATION *UNUSUAL SHORTNESS OF BREATH *UNUSUAL BRUISING OR BLEEDING *URINARY PROBLEMS (pain or burning when urinating, or frequent urination) *BOWEL PROBLEMS (unusual diarrhea, constipation, pain near the anus) TENDERNESS IN MOUTH AND THROAT WITH OR WITHOUT PRESENCE OF ULCERS (sore throat, sores in mouth, or a toothache) UNUSUAL RASH, SWELLING OR PAIN  UNUSUAL VAGINAL DISCHARGE OR ITCHING   Items with * indicate a potential emergency and should be followed up as soon as possible or go to the Emergency Department if any problems should occur.  Please show the CHEMOTHERAPY ALERT CARD or IMMUNOTHERAPY ALERT CARD at check-in to the Emergency Department and triage nurse.  Should you have questions after your visit or need to cancel  or reschedule your appointment, please contact [facility] [phone]  and follow the prompts.  Office hours are 8:00 a.m. to 4:30 p.m. Monday - Friday. Please note that voicemails left after 4:00 p.m. may not be returned until the following business day.  We are closed weekends and major holidays. You have access to a nurse at all times for urgent questions. Please call the main number to the clinic [phone] and follow the prompts.  For any non-urgent questions, you may also contact your provider using MyChart. We now offer e-Visits for anyone 18 and older to request care online for non-urgent symptoms. For details visit [email or web address].   Also download the MyChart app! Go to the app store, search "MyChart", open the app, select [facility], and log in with your MyChart username and password.  Due to Covid, a mask is required upon entering the hospital/clinic. If you do not have a mask, one will be given to you upon arrival. For doctor visits, patients may have 1 support person aged 18 or older with them. For treatment visits, patients cannot have anyone with them due to current Covid guidelines and our immunocompromised population.  

## 2021-06-03 ENCOUNTER — Inpatient Hospital Stay (HOSPITAL_COMMUNITY): Payer: Medicaid Other | Attending: Hematology

## 2021-06-03 DIAGNOSIS — D500 Iron deficiency anemia secondary to blood loss (chronic): Secondary | ICD-10-CM

## 2021-06-03 DIAGNOSIS — D508 Other iron deficiency anemias: Secondary | ICD-10-CM | POA: Insufficient documentation

## 2021-06-03 LAB — CBC WITH DIFFERENTIAL/PLATELET
Abs Immature Granulocytes: 0.04 10*3/uL (ref 0.00–0.07)
Basophils Absolute: 0.1 10*3/uL (ref 0.0–0.1)
Basophils Relative: 1 %
Eosinophils Absolute: 0.7 10*3/uL — ABNORMAL HIGH (ref 0.0–0.5)
Eosinophils Relative: 6 %
HCT: 43.4 % (ref 36.0–46.0)
Hemoglobin: 13.5 g/dL (ref 12.0–15.0)
Immature Granulocytes: 0 %
Lymphocytes Relative: 15 %
Lymphs Abs: 1.6 10*3/uL (ref 0.7–4.0)
MCH: 25.6 pg — ABNORMAL LOW (ref 26.0–34.0)
MCHC: 31.1 g/dL (ref 30.0–36.0)
MCV: 82.2 fL (ref 80.0–100.0)
Monocytes Absolute: 0.6 10*3/uL (ref 0.1–1.0)
Monocytes Relative: 5 %
Neutro Abs: 7.9 10*3/uL — ABNORMAL HIGH (ref 1.7–7.7)
Neutrophils Relative %: 73 %
Platelets: 371 10*3/uL (ref 150–400)
RBC: 5.28 MIL/uL — ABNORMAL HIGH (ref 3.87–5.11)
RDW: 16.5 % — ABNORMAL HIGH (ref 11.5–15.5)
WBC: 10.9 10*3/uL — ABNORMAL HIGH (ref 4.0–10.5)
nRBC: 0 % (ref 0.0–0.2)

## 2021-06-03 LAB — IRON AND TIBC
Iron: 101 ug/dL (ref 28–170)
Saturation Ratios: 20 % (ref 10.4–31.8)
TIBC: 514 ug/dL — ABNORMAL HIGH (ref 250–450)
UIBC: 413 ug/dL

## 2021-06-03 LAB — FERRITIN: Ferritin: 14 ng/mL (ref 11–307)

## 2021-06-07 ENCOUNTER — Ambulatory Visit: Payer: Medicaid Other | Admitting: Family Medicine

## 2021-06-07 ENCOUNTER — Other Ambulatory Visit: Payer: Self-pay

## 2021-06-07 VITALS — BP 118/70 | HR 102 | Temp 97.4°F | Ht 61.0 in | Wt 281.2 lb

## 2021-06-07 DIAGNOSIS — J4541 Moderate persistent asthma with (acute) exacerbation: Secondary | ICD-10-CM

## 2021-06-07 MED ORDER — PREDNISONE 20 MG PO TABS: ORAL_TABLET | ORAL | 0 refills | Status: DC

## 2021-06-07 NOTE — Progress Notes (Signed)
? ? ?Subjective:  ? ? Patient ID: Heidi Clark, female    DOB: 07/17/1990, 31 y.o.   MRN: [id number] ? ?HPI: ?Heidi Clark is a 31 y.o. female presenting for cough and ear ringing follow up. ? ?Chief Complaint  ?Patient presents with  ? Cough  ?  Bad cough don't remember when it started  ? ?COUGH ?Patient is not sure when the cough started.  She believes that last week.  She denies any fevers or chills or chest pain.  She denies any purulent sputum.  It is difficult to ascertain her compliance with medication.  She is not sure the medicine she is taking however she states that she is taking the Symbicort twice daily.  She seemed confused when I asked her about her albuterol.  I am not certain if she is even taking albuterol as a rescue medicine.  Today she is audibly wheezing all throughout on exam.  She has rhonchorous breath sounds in all 4 lung fields.  She has poor air movement and diminished breath sounds bilaterally. ?Allergies  ?Allergen Reactions  ? No Known Allergies   ? ? ?Outpatient Encounter Medications as of 06/07/2021  ?Medication Sig  ? amLODipine (NORVASC) 5 MG tablet Take 1 tablet (5 mg total) by mouth daily.  ? budesonide-formoterol (SYMBICORT) 160-4.5 MCG/ACT inhaler Inhale 2 puffs into the lungs 2 (two) times daily.  ? cetirizine (ZYRTEC) 10 MG tablet Take 1 tablet (10 mg total) by mouth daily.  ? Cholecalciferol 5000 units capsule Take 1 capsule (5,000 Units total) by mouth daily.  ? ferrous sulfate 325 (65 FE) MG tablet TAKE 1 TABLET BY MOUTH TWICE A DAY  ? fluticasone (FLONASE) 50 MCG/ACT nasal spray SPRAY 2 SPRAYS INTO EACH NOSTRIL EVERY DAY  ? mometasone (ELOCON) 0.1 % cream Apply topically daily.  ? Norethindrone-Ethinyl Estradiol-Fe Biphas (LO LOESTRIN FE) 1 MG-10 MCG / 10 MCG tablet Take 1 tablet by mouth daily.  ? omeprazole (PRILOSEC) 20 MG capsule Take 1 capsule (20 mg total) by mouth daily.  ? predniSONE (DELTASONE) 20 MG tablet 3 tabs poqday 1-2, 2 tabs poqday 3-4, 1 tab  poqday 5-6  ? PROAIR HFA 108 (90 Base) MCG/ACT inhaler TAKE 2 PUFFS BY MOUTH EVERY 6 HOURS AS NEEDED FOR WHEEZE OR SHORTNESS OF BREATH  ? vitamin B-12 (CYANOCOBALAMIN) 1000 MCG tablet Take 1 tablet (1,000 mcg total) by mouth daily.  ? ?No facility-administered encounter medications on file as of 06/07/2021.  ? ? ?Patient Active Problem List  ? Diagnosis Date Noted  ? Iron deficiency anemia due to chronic blood loss 03/03/2021  ? Skin abrasion 10/20/2020  ? Elevated hemoglobin A1c 10/20/2020  ? Low vitamin D level 10/20/2020  ? Tinnitus of right ear 04/02/2020  ? Chronic cough 02/27/2020  ? Hypertension 11/25/2019  ? Encounter for well woman exam with routine gynecological exam 09/02/2019  ? Encounter for menstrual regulation 11/30/2017  ? Morbid obesity (HCC) 11/30/2017  ? Well woman exam with routine gynecological exam 09/25/2015  ? Polycystic ovarian syndrome 08/21/2014  ? ADHD (attention deficit hyperactivity disorder)   ? Insulin resistance   ? Legg-Calve-Perthes disease   ? Class 3 obesity due to excess calories without serious comorbidity with body mass index (BMI) of 50.0 to 59.9 in adult   ? GERD (gastroesophageal reflux disease) 10/13/2010  ? Obesity 10/13/2010  ? ? ?Past Medical History:  ?Diagnosis Date  ? ADHD (attention deficit hyperactivity disorder)   ? ADHD (attention deficit hyperactivity disorder)   ?  Elevated BP without diagnosis of hypertension 09/02/2019  ? Encounter for menstrual regulation 09/25/2015  ? GERD (gastroesophageal reflux disease)   ? Headache(784.0)   ? Insulin resistance   ? Obesity   ? Polycystic ovarian syndrome   ? Rash and nonspecific skin eruption 02/27/2020  ? ?Relevant past medical, surgical, family and social history reviewed and updated as indicated. Interim medical history since our last visit reviewed. ? ?Review of Systems ?Per HPI unless specifically indicated above ? ?   ?Objective:  ?  ?BP 118/70   Pulse (!) 102   Temp (!) 97.4 ?F (36.3 ?C) (Temporal)   Ht 5\' 1"   (1.549 m)   Wt 281 lb 3.2 oz (127.6 kg)   SpO2 96%   BMI 53.13 kg/m?   ?Wt Readings from Last 3 Encounters:  ?06/07/21 281 lb 3.2 oz (127.6 kg)  ?04/02/21 278 lb (126.1 kg)  ?03/03/21 278 lb 3.2 oz (126.2 kg)  ?  ?Physical Exam ?Vitals and nursing note reviewed.  ?Constitutional:   ?   General: She is not in acute distress. ?   Appearance: She is obese. She is not toxic-appearing.  ?HENT:  ?   Head: Normocephalic and atraumatic.  ?   Right Ear: Tympanic membrane and ear canal normal.  ?   Left Ear: Tympanic membrane and ear canal normal.  ?   Nose: Nose normal. No congestion or rhinorrhea.  ?   Mouth/Throat:  ?   Mouth: Mucous membranes are moist.  ?   Pharynx: Oropharynx is clear. No oropharyngeal exudate or posterior oropharyngeal erythema.  ?Eyes:  ?   General: No scleral icterus. ?Cardiovascular:  ?   Rate and Rhythm: Tachycardia present.  ?   Heart sounds: Normal heart sounds. No murmur heard. ?Pulmonary:  ?   Effort: Pulmonary effort is normal. No respiratory distress.  ?   Breath sounds: Decreased air movement present. No stridor. Examination of the right-middle field reveals rhonchi. Examination of the left-middle field reveals rhonchi. Examination of the right-lower field reveals rhonchi. Examination of the left-lower field reveals rhonchi. Decreased breath sounds, wheezing and rhonchi present. No rales.  ?Musculoskeletal:  ?   Cervical back: Normal range of motion.  ?Lymphadenopathy:  ?   Cervical: No cervical adenopathy.  ?Neurological:  ?   Mental Status: She is alert and oriented to person, place, and time. Mental status is at baseline.  ? ?   ?Assessment & Plan:  ? ?Moderate persistent asthma with exacerbation ?Patient is having an acute asthma attack.  Begin prednisone taper pack and use albuterol 2 puffs every 4-6 hours as needed.  Continue Symbicort 3 puffs twice daily.  Recheck in 48 hours if no better or sooner if worsening. ?

## 2021-06-10 ENCOUNTER — Ambulatory Visit (HOSPITAL_COMMUNITY): Payer: Medicaid Other | Admitting: Physician Assistant

## 2021-06-15 ENCOUNTER — Other Ambulatory Visit: Payer: Self-pay | Admitting: Family Medicine

## 2021-06-15 DIAGNOSIS — R053 Chronic cough: Secondary | ICD-10-CM

## 2021-06-21 NOTE — Progress Notes (Signed)
? ?[facility] ?[address] ?[place], [place] [address] ? ? ?CLINIC:  ?Medical Oncology/Hematology ? ?PCP:  ?Heidi Clark, Heidi T, MD ?[address] ?[place] [address] ?[phone] ? ? ?REASON FOR VISIT:  ?Follow-up for iron deficiency anemia ?  ?PREVIOUS THERAPY: Daily iron supplementation, PRBC transfusion x2 (10/27/2020)  ?  ?CURRENT THERAPY: Intermittent IV iron infusions ? ?INTERVAL HISTORY:  ?Heidi Clark 31 y.o. female returns for routine follow-up of her iron deficiency anemia, which is thought to be secondary to heavy menstrual bleeding.  She was last seen by Heidi Brennerebekah Yarianna Varble PA-C on 03/03/2021.  She received IV iron with Venofer 300 mg x 3 in January and February 2023. ? ?At today's visit, she reports feeling well.  She reports that she has improved energy after her IV iron.  She denies any bright blood per rectum or melena.  She reports that her periods are light.  She denies any pica, restless legs, headaches, chest pain, dyspnea exertion, lightheadedness, or syncope ? ?She has 100% energy and 100% appetite. She endorses that she is maintaining a stable weight. ? ? ?REVIEW OF SYSTEMS:  ?Review of Systems  ?Constitutional:  Negative for appetite change, chills, diaphoresis, fatigue, fever and unexpected weight change.  ?HENT:   Negative for lump/mass and nosebleeds.   ?Eyes:  Negative for eye problems.  ?Respiratory:  Negative for cough, hemoptysis and shortness of breath.   ?Cardiovascular:  Negative for chest pain, leg swelling and palpitations.  ?Gastrointestinal:  Negative for abdominal pain, blood in stool, constipation, diarrhea, nausea and vomiting.  ?Genitourinary:  Negative for hematuria.   ?Skin: Negative.   ?Neurological:  Negative for dizziness, headaches and light-headedness.  ?Hematological:  Does not bruise/bleed easily.   ? ? ?PAST MEDICAL/SURGICAL HISTORY:  ?Past Medical History:  ?Diagnosis Date  ? ADHD (attention deficit hyperactivity disorder)   ? ADHD (attention  deficit hyperactivity disorder)   ? Elevated BP without diagnosis of hypertension 09/02/2019  ? Encounter for menstrual regulation 09/25/2015  ? GERD (gastroesophageal reflux disease)   ? Headache(784.0)   ? Insulin resistance   ? Obesity   ? Polycystic ovarian syndrome   ? Rash and nonspecific skin eruption 02/27/2020  ? ?Past Surgical History:  ?Procedure Laterality Date  ? Right hip    ? Age 83  ? TOOTH EXTRACTION N/A 08/12/2016  ? Procedure: DENTAL RESTORATION/EXTRACTIONS;  Surgeon: Ocie DoyneJensen, Scott, DDS;  [facility];  Service: Oral Surgery;  Laterality: N/A;  ? ? ? ?SOCIAL HISTORY:  ?Social History  ? ?Socioeconomic History  ? Marital status: Single  ?  Spouse name: Not on file  ? Number of children: Not on file  ? Years of education: Not on file  ? Highest education level: Not on file  ?Occupational History  ? Not on file  ?Tobacco Use  ? Smoking status: Never  ? Smokeless tobacco: Never  ?Vaping Use  ? Vaping Use: Never used  ?Substance and Sexual Activity  ? Alcohol use: No  ? Drug use: No  ? Sexual activity: Never  ?  Birth control/protection: Other-see comments, Pill  ?  Comment: CELIBATE  ?Other Topics Concern  ? Not on file  ?Social History Narrative  ? Not on file  ? ?Social Determinants of Health  ? ?Financial Resource Strain: Low Risk   ? Difficulty of Paying Living Expenses: Not hard at all  ?Food Insecurity: Food Insecurity Present  ? Worried About Programme researcher, broadcasting/film/videounning Out of Food in the Last Year: Sometimes true  ?  Ran Out of Food in the Last Year: Sometimes true  ?Transportation Needs: No Transportation Needs  ? Lack of Transportation (Medical): No  ? Lack of Transportation (Non-Medical): No  ?Physical Activity: Inactive  ? Days of Exercise per Week: 0 days  ? Minutes of Exercise per Session: 0 min  ?Stress: No Stress Concern Present  ? Feeling of Stress : Not at all  ?Social Connections: Socially Isolated  ? Frequency of Communication with Friends and Family: Never  ? Frequency of Social Gatherings with  Friends and Family: Never  ? Attends Religious Services: Never  ? Active Member of Clubs or Organizations: No  ? Attends Banker Meetings: Never  ? Marital Status: Never married  ?Intimate Partner Violence: Not At Risk  ? Fear of Current or Ex-Partner: No  ? Emotionally Abused: No  ? Physically Abused: No  ? Sexually Abused: No  ? ? ?FAMILY HISTORY:  ?Family History  ?Problem Relation Age of Onset  ? Diabetes Mother   ? Obesity Mother   ? Colon cancer Neg Hx   ? ? ?CURRENT MEDICATIONS:  ?Outpatient Encounter Medications as of 06/22/2021  ?Medication Sig  ? amLODipine (NORVASC) 5 MG tablet Take 1 tablet (5 mg total) by mouth daily.  ? budesonide-formoterol (SYMBICORT) 160-4.5 MCG/ACT inhaler Inhale 2 puffs into the lungs 2 (two) times daily.  ? cetirizine (ZYRTEC) 10 MG tablet Take 1 tablet (10 mg total) by mouth daily.  ? Cholecalciferol 5000 units capsule Take 1 capsule (5,000 Units total) by mouth daily.  ? ferrous sulfate 325 (65 FE) MG tablet TAKE 1 TABLET BY MOUTH TWICE A DAY  ? fluticasone (FLONASE) 50 MCG/ACT nasal spray SPRAY 2 SPRAYS INTO EACH NOSTRIL EVERY DAY  ? mometasone (ELOCON) 0.1 % cream APPLY TO AFFECTED AREA TOPICALLY EVERY DAY  ? Norethindrone-Ethinyl Estradiol-Fe Biphas (LO LOESTRIN FE) 1 MG-10 MCG / 10 MCG tablet Take 1 tablet by mouth daily.  ? omeprazole (PRILOSEC) 20 MG capsule Take 1 capsule (20 mg total) by mouth daily.  ? predniSONE (DELTASONE) 20 MG tablet 3 tabs poqday 1-2, 2 tabs poqday 3-4, 1 tab poqday 5-6  ? VENTOLIN HFA 108 (90 Base) MCG/ACT inhaler TAKE 2 PUFFS BY MOUTH EVERY 6 HOURS AS NEEDED FOR WHEEZE OR SHORTNESS OF BREATH  ? vitamin B-12 (CYANOCOBALAMIN) 1000 MCG tablet Take 1 tablet (1,000 mcg total) by mouth daily.  ? ?No facility-administered encounter medications on file as of 06/22/2021.  ? ? ?ALLERGIES:  ?Allergies  ?Allergen Reactions  ? No Known Allergies   ? ? ? ?PHYSICAL EXAM:  ?ECOG PERFORMANCE STATUS: 0 - Asymptomatic ? ?There were no vitals filed for  this visit. ?There were no vitals filed for this visit. ?Physical Exam ?Constitutional:   ?   Appearance: Normal appearance. She is morbidly obese.  ?   Comments: Bedbugs noted on exam  ?HENT:  ?   Head: Normocephalic and atraumatic.  ?   Mouth/Throat:  ?   Mouth: Mucous membranes are moist.  ?Eyes:  ?   Extraocular Movements: Extraocular movements intact.  ?   Pupils: Pupils are equal, round, and reactive to light.  ?Cardiovascular:  ?   Rate and Rhythm: Regular rhythm. Tachycardia present.  ?   Pulses: Normal pulses.  ?   Heart sounds: Normal heart sounds.  ?Pulmonary:  ?   Effort: Pulmonary effort is normal.  ?   Breath sounds: Normal breath sounds.  ?Abdominal:  ?   General: Bowel sounds are normal.  ?  Palpations: Abdomen is soft.  ?   Tenderness: There is no abdominal tenderness.  ?Musculoskeletal:     ?   General: No swelling.  ?   Right lower leg: No edema.  ?   Left lower leg: No edema.  ?Lymphadenopathy:  ?   Cervical: No cervical adenopathy.  ?Skin: ?   General: Skin is warm and dry.  ?Neurological:  ?   General: No focal deficit present.  ?   Mental Status: She is alert and oriented to person, place, and time.  ?Psychiatric:     ?   Mood and Affect: Mood normal.     ?   Behavior: Behavior normal.  ? ? ? ?LABORATORY DATA:  ?I have reviewed the labs as listed.  ?CBC ?   ?Component Value Date/Time  ? WBC 10.9 (H) 06/03/2021 1006  ? RBC 5.28 (H) 06/03/2021 1006  ? HGB 13.5 06/03/2021 1006  ? HGB 6.3 (LL) 10/20/2020 0942  ? HCT 43.4 06/03/2021 1006  ? HCT 25.8 (L) 10/20/2020 0942  ? PLT 371 06/03/2021 1006  ? PLT 363 10/20/2020 0942  ? MCV 82.2 06/03/2021 1006  ? MCV 63 (L) 10/20/2020 0942  ? MCH 25.6 (L) 06/03/2021 1006  ? MCHC 31.1 06/03/2021 1006  ? RDW 16.5 (H) 06/03/2021 1006  ? RDW 20.5 (H) 10/20/2020 9242  ? LYMPHSABS 1.6 06/03/2021 1006  ? MONOABS 0.6 06/03/2021 1006  ? EOSABS 0.7 (H) 06/03/2021 1006  ? BASOSABS 0.1 06/03/2021 1006  ? ? ?  Latest Ref Rng & Units 02/26/2021  ?  9:15 AM 11/27/2020  ?   9:10 AM 10/27/2020  ? 12:47 PM  ?CMP  ?Glucose 70 - 99 mg/dL 683   419   622    ?BUN 6 - 20 mg/dL 14   14   14     ?Creatinine 0.44 - 1.00 mg/dL   2.97   9.89    ?Sodium 135 - 145 mmol/L 138   138   139

## 2021-06-22 ENCOUNTER — Inpatient Hospital Stay (HOSPITAL_COMMUNITY): Payer: Medicaid Other | Attending: Hematology | Admitting: Physician Assistant

## 2021-06-22 VITALS — BP 129/78 | HR 105 | Temp 98.0°F | Resp 19 | Ht 61.0 in | Wt 282.2 lb

## 2021-06-22 DIAGNOSIS — D500 Iron deficiency anemia secondary to blood loss (chronic): Secondary | ICD-10-CM | POA: Insufficient documentation

## 2021-06-22 DIAGNOSIS — Z79899 Other long term (current) drug therapy: Secondary | ICD-10-CM | POA: Diagnosis not present

## 2021-06-22 NOTE — Patient Instructions (Signed)
[facility] ?Discharge Instructions ? ?You were seen today by Rojelio Brenner PA-C for your iron deficiency anemia.  Your iron levels are still low at this visit, so we will schedule you for IV iron infusions.   ? ?We will check your labs again in 4 months followed by a phone visit. ? ? ?Thank you for choosing [facility] at [facility] to provide your oncology and hematology care.  To afford each patient quality time with our provider, please arrive at least 15 minutes before your scheduled appointment time.  ? ?If you have a lab appointment with the Cancer Center please come in thru the Main Entrance and check in at the main information desk. ? ?You need to re-schedule your appointment should you arrive 10 or more minutes late.  We strive to give you quality time with our providers, and arriving late affects you and other patients whose appointments are after yours.  Also, if you no show three or more times for appointments you may be dismissed from the clinic at the providers discretion.     ?Again, thank you for choosing [facility].  Our hope is that these requests will decrease the amount of time that you wait before being seen by our physicians.       ?_____________________________________________________________ ? ?Should you have questions after your visit to [facility], please contact our office at [phone] and follow the prompts.  Our office hours are 8:00 a.m. and 4:30 p.m. Monday - Friday.  Please note that voicemails left after 4:00 p.m. may not be returned until the following business day.  We are closed weekends and major holidays.  You do have access to a nurse 24-7, just call the main number to the clinic [phone] and do not press any options, hold on the line and a nurse will answer the phone.   ? ?For prescription refill requests, have your pharmacy contact our office and allow 72 hours.   ? ?Due to  Covid, you will need to wear a mask upon entering the hospital. If you do not have a mask, a mask will be given to you at the Main Entrance upon arrival. For doctor visits, patients may have 1 support person age 29 or older with them. For treatment visits, patients can not have anyone with them due to social distancing guidelines and our immunocompromised population.  ? ? ? ?

## 2021-06-25 ENCOUNTER — Inpatient Hospital Stay (HOSPITAL_COMMUNITY): Payer: Medicaid Other

## 2021-06-25 VITALS — BP 135/76 | HR 105 | Temp 98.7°F | Resp 20

## 2021-06-25 DIAGNOSIS — D500 Iron deficiency anemia secondary to blood loss (chronic): Secondary | ICD-10-CM | POA: Diagnosis not present

## 2021-06-25 LAB — OCCULT BLOOD X 1 CARD TO LAB, STOOL
Fecal Occult Bld: NEGATIVE
Fecal Occult Bld: NEGATIVE

## 2021-06-25 MED ORDER — SODIUM CHLORIDE 0.9 % IV SOLN
300.0000 mg | Freq: Once | INTRAVENOUS | Status: AC
  Administered 2021-06-25: 300 mg via INTRAVENOUS
  Filled 2021-06-25: qty 300

## 2021-06-25 MED ORDER — ACETAMINOPHEN 325 MG PO TABS
650.0000 mg | ORAL_TABLET | Freq: Once | ORAL | Status: AC
  Administered 2021-06-25: 650 mg via ORAL
  Filled 2021-06-25: qty 2

## 2021-06-25 MED ORDER — SODIUM CHLORIDE 0.9 % IV SOLN: Freq: Once | INTRAVENOUS | Status: AC

## 2021-06-25 MED ORDER — LORATADINE 10 MG PO TABS
10.0000 mg | ORAL_TABLET | Freq: Once | ORAL | Status: AC
  Administered 2021-06-25: 10 mg via ORAL
  Filled 2021-06-25: qty 1

## 2021-06-25 NOTE — Progress Notes (Signed)
Pt presents today for Venofer IV iron infusion per provider's order. Vital signs stable and pt voiced no new complaints at this time.  Peripheral IV started with good blood return pre and post infusion.  Venofer 300 mg  given today per MD orders. Tolerated infusion without adverse affects. Vital signs stable. No complaints at this time. Discharged from clinic ambulatory in stable condition. Alert and oriented x 3. F/U with [facility] as scheduled.   

## 2021-06-25 NOTE — Patient Instructions (Signed)
[facility]  Discharge Instructions: Thank you for choosing [facility] to provide your oncology and hematology care.  If you have a lab appointment with the Cancer Center, please come in thru the Main Entrance and check in at the main information desk.  Wear comfortable clothing and clothing appropriate for easy access to any Portacath or PICC line.   We strive to give you quality time with your provider. You may need to reschedule your appointment if you arrive late (15 or more minutes).  Arriving late affects you and other patients whose appointments are after yours.  Also, if you miss three or more appointments without notifying the office, you may be dismissed from the clinic at the provider's discretion.      For prescription refill requests, have your pharmacy contact our office and allow 72 hours for refills to be completed.    Today you received Venofer IV iron infusion.     BELOW ARE SYMPTOMS THAT SHOULD BE REPORTED IMMEDIATELY: *FEVER GREATER THAN 100.4 F (38 C) OR HIGHER *CHILLS OR SWEATING *NAUSEA AND VOMITING THAT IS NOT CONTROLLED WITH YOUR NAUSEA MEDICATION *UNUSUAL SHORTNESS OF BREATH *UNUSUAL BRUISING OR BLEEDING *URINARY PROBLEMS (pain or burning when urinating, or frequent urination) *BOWEL PROBLEMS (unusual diarrhea, constipation, pain near the anus) TENDERNESS IN MOUTH AND THROAT WITH OR WITHOUT PRESENCE OF ULCERS (sore throat, sores in mouth, or a toothache) UNUSUAL RASH, SWELLING OR PAIN  UNUSUAL VAGINAL DISCHARGE OR ITCHING   Items with * indicate a potential emergency and should be followed up as soon as possible or go to the Emergency Department if any problems should occur.  Please show the CHEMOTHERAPY ALERT CARD or IMMUNOTHERAPY ALERT CARD at check-in to the Emergency Department and triage nurse.  Should you have questions after your visit or need to cancel or reschedule your appointment, please contact [facility]  [phone]  and follow the prompts.  Office hours are 8:00 a.m. to 4:30 p.m. Monday - Friday. Please note that voicemails left after 4:00 p.m. may not be returned until the following business day.  We are closed weekends and major holidays. You have access to a nurse at all times for urgent questions. Please call the main number to the clinic [phone] and follow the prompts.  For any non-urgent questions, you may also contact your provider using MyChart. We now offer e-Visits for anyone 18 and older to request care online for non-urgent symptoms. For details visit [email or web address].   Also download the MyChart app! Go to the app store, search "MyChart", open the app, select [facility], and log in with your MyChart username and password.  Due to Covid, a mask is required upon entering the hospital/clinic. If you do not have a mask, one will be given to you upon arrival. For doctor visits, patients may have 1 support person aged 18 or older with them. For treatment visits, patients cannot have anyone with them due to current Covid guidelines and our immunocompromised population.  

## 2021-07-02 ENCOUNTER — Inpatient Hospital Stay (HOSPITAL_COMMUNITY): Payer: Medicaid Other

## 2021-07-02 VITALS — BP 139/87 | HR 108 | Temp 98.6°F | Resp 18

## 2021-07-02 DIAGNOSIS — D500 Iron deficiency anemia secondary to blood loss (chronic): Secondary | ICD-10-CM | POA: Diagnosis not present

## 2021-07-02 MED ORDER — ACETAMINOPHEN 325 MG PO TABS
650.0000 mg | ORAL_TABLET | Freq: Once | ORAL | Status: AC
  Administered 2021-07-02: 650 mg via ORAL
  Filled 2021-07-02: qty 2

## 2021-07-02 MED ORDER — SODIUM CHLORIDE 0.9 % IV SOLN
300.0000 mg | Freq: Once | INTRAVENOUS | Status: AC
  Administered 2021-07-02: 300 mg via INTRAVENOUS
  Filled 2021-07-02: qty 300

## 2021-07-02 MED ORDER — SODIUM CHLORIDE 0.9 % IV SOLN: Freq: Once | INTRAVENOUS | Status: AC

## 2021-07-02 MED ORDER — LORATADINE 10 MG PO TABS
10.0000 mg | ORAL_TABLET | Freq: Once | ORAL | Status: AC
  Administered 2021-07-02: 10 mg via ORAL
  Filled 2021-07-02: qty 1

## 2021-07-02 NOTE — Patient Instructions (Signed)
[facility]  Discharge Instructions: Thank you for choosing [facility] to provide your oncology and hematology care.  If you have a lab appointment with the Cancer Center, please come in thru the Main Entrance and check in at the main information desk.  Wear comfortable clothing and clothing appropriate for easy access to any Portacath or PICC line.   We strive to give you quality time with your provider. You may need to reschedule your appointment if you arrive late (15 or more minutes).  Arriving late affects you and other patients whose appointments are after yours.  Also, if you miss three or more appointments without notifying the office, you may be dismissed from the clinic at the provider's discretion.      For prescription refill requests, have your pharmacy contact our office and allow 72 hours for refills to be completed.        To help prevent nausea and vomiting after your treatment, we encourage you to take your nausea medication as directed.  BELOW ARE SYMPTOMS THAT SHOULD BE REPORTED IMMEDIATELY: *FEVER GREATER THAN 100.4 F (38 C) OR HIGHER *CHILLS OR SWEATING *NAUSEA AND VOMITING THAT IS NOT CONTROLLED WITH YOUR NAUSEA MEDICATION *UNUSUAL SHORTNESS OF BREATH *UNUSUAL BRUISING OR BLEEDING *URINARY PROBLEMS (pain or burning when urinating, or frequent urination) *BOWEL PROBLEMS (unusual diarrhea, constipation, pain near the anus) TENDERNESS IN MOUTH AND THROAT WITH OR WITHOUT PRESENCE OF ULCERS (sore throat, sores in mouth, or a toothache) UNUSUAL RASH, SWELLING OR PAIN  UNUSUAL VAGINAL DISCHARGE OR ITCHING   Items with * indicate a potential emergency and should be followed up as soon as possible or go to the Emergency Department if any problems should occur.  Please show the CHEMOTHERAPY ALERT CARD or IMMUNOTHERAPY ALERT CARD at check-in to the Emergency Department and triage nurse.  Should you have questions after your visit or need to cancel  or reschedule your appointment, please contact [facility] [phone]  and follow the prompts.  Office hours are 8:00 a.m. to 4:30 p.m. Monday - Friday. Please note that voicemails left after 4:00 p.m. may not be returned until the following business day.  We are closed weekends and major holidays. You have access to a nurse at all times for urgent questions. Please call the main number to the clinic [phone] and follow the prompts.  For any non-urgent questions, you may also contact your provider using MyChart. We now offer e-Visits for anyone 18 and older to request care online for non-urgent symptoms. For details visit [email or web address].   Also download the MyChart app! Go to the app store, search "MyChart", open the app, select [place], and log in with your MyChart username and password.  Due to Covid, a mask is required upon entering the hospital/clinic. If you do not have a mask, one will be given to you upon arrival. For doctor visits, patients may have 1 support person aged 18 or older with them. For treatment visits, patients cannot have anyone with them due to current Covid guidelines and our immunocompromised population.  

## 2021-07-02 NOTE — Progress Notes (Signed)
Iron infusion given per orders. Patient tolerated it well without problems. Vitals stable and discharged home from clinic ambulatory. Follow up as scheduled.  

## 2021-07-09 ENCOUNTER — Inpatient Hospital Stay (HOSPITAL_COMMUNITY): Payer: Medicaid Other

## 2021-07-09 ENCOUNTER — Encounter (HOSPITAL_COMMUNITY): Payer: Self-pay

## 2021-07-09 VITALS — BP 142/89 | HR 98 | Temp 97.3°F | Resp 20

## 2021-07-09 DIAGNOSIS — D500 Iron deficiency anemia secondary to blood loss (chronic): Secondary | ICD-10-CM | POA: Diagnosis not present

## 2021-07-09 MED ORDER — SODIUM CHLORIDE 0.9 % IV SOLN: Freq: Once | INTRAVENOUS | Status: AC

## 2021-07-09 MED ORDER — SODIUM CHLORIDE 0.9 % IV SOLN
300.0000 mg | Freq: Once | INTRAVENOUS | Status: AC
  Administered 2021-07-09: 300 mg via INTRAVENOUS
  Filled 2021-07-09: qty 300

## 2021-07-09 MED ORDER — ACETAMINOPHEN 325 MG PO TABS
650.0000 mg | ORAL_TABLET | Freq: Once | ORAL | Status: AC
  Administered 2021-07-09: 650 mg via ORAL
  Filled 2021-07-09: qty 2

## 2021-07-09 MED ORDER — LORATADINE 10 MG PO TABS
10.0000 mg | ORAL_TABLET | Freq: Once | ORAL | Status: AC
  Administered 2021-07-09: 10 mg via ORAL
  Filled 2021-07-09: qty 1

## 2021-07-09 NOTE — Progress Notes (Unsigned)
Patient presents today for Venofer 300 mgs. MAR reviewed and updated. Vital signs stable and patient has no complaints today. Patient denies any changes since her last iron.  ? ?Venofer 300 mg given today per MD orders. Tolerated infusion without adverse affects. Vital signs stable. No complaints at this time. Discharged from clinic ambulatory in stable condition. Alert and oriented x 3. F/U with [facility] as scheduled.   ?

## 2021-07-09 NOTE — Patient Instructions (Signed)
[facility]  Discharge Instructions: ?Thank you for choosing [facility] to provide your oncology and hematology care.  ?If you have a lab appointment with the [facility], please come in thru the Main Entrance and check in at the main information desk. ? ?Wear comfortable clothing and clothing appropriate for easy access to any Portacath or PICC line.  ? ?We strive to give you quality time with your provider. You may need to reschedule your appointment if you arrive late (15 or more minutes).  Arriving late affects you and other patients whose appointments are after yours.  Also, if you miss three or more appointments without notifying the office, you may be dismissed from the clinic at the provider?s discretion.    ?  ?For prescription refill requests, have your pharmacy contact our office and allow 72 hours for refills to be completed.   ? ?Today you received the following chemotherapy and/or immunotherapy agents Vonofer 300 mg    ?  ?To help prevent nausea and vomiting after your treatment, we encourage you to take your nausea medication as directed. ? ?BELOW ARE SYMPTOMS THAT SHOULD BE REPORTED IMMEDIATELY: ?*FEVER GREATER THAN 100.4 F (38 ?C) OR HIGHER ?*CHILLS OR SWEATING ?*NAUSEA AND VOMITING THAT IS NOT CONTROLLED WITH YOUR NAUSEA MEDICATION ?*UNUSUAL SHORTNESS OF BREATH ?*UNUSUAL BRUISING OR BLEEDING ?*URINARY PROBLEMS (pain or burning when urinating, or frequent urination) ?*BOWEL PROBLEMS (unusual diarrhea, constipation, pain near the anus) ?TENDERNESS IN MOUTH AND THROAT WITH OR WITHOUT PRESENCE OF ULCERS (sore throat, sores in mouth, or a toothache) ?UNUSUAL RASH, SWELLING OR PAIN  ?UNUSUAL VAGINAL DISCHARGE OR ITCHING  ? ?Items with * indicate a potential emergency and should be followed up as soon as possible or go to the Emergency Department if any problems should occur. ? ?Please show the CHEMOTHERAPY ALERT CARD or IMMUNOTHERAPY ALERT CARD at check-in to the Emergency  Department and triage nurse. ? ?Should you have questions after your visit or need to cancel or reschedule your appointment, please contact [facility] [phone]  and follow the prompts.  Office hours are 8:00 a.m. to 4:30 p.m. Monday - Friday. Please note that voicemails left after 4:00 p.m. may not be returned until the following business day.  We are closed weekends and major holidays. You have access to a nurse at all times for urgent questions. Please call the main number to the clinic [phone] and follow the prompts. ? ?For any non-urgent questions, you may also contact your provider using MyChart. We now offer e-Visits for anyone 29 and older to request care online for non-urgent symptoms. For details visit [email or web address]. ?  ?Also download the MyChart app! Go to the app store, search "MyChart", open the app, select [place], and log in with your MyChart username and password. ? ?Due to Covid, a mask is required upon entering the hospital/clinic. If you do not have a mask, one will be given to you upon arrival. For doctor visits, patients may have 1 support person aged 22 or older with them. For treatment visits, patients cannot have anyone with them due to current Covid guidelines and our immunocompromised population.  ?

## 2021-08-11 ENCOUNTER — Other Ambulatory Visit: Payer: Self-pay | Admitting: Family Medicine

## 2021-08-11 DIAGNOSIS — R053 Chronic cough: Secondary | ICD-10-CM

## 2021-08-12 NOTE — Telephone Encounter (Signed)
Requested Prescriptions  Pending Prescriptions Disp Refills  . VENTOLIN HFA 108 (90 Base) MCG/ACT inhaler [Pharmacy Med Name: VENTOLIN HFA 90 MCG INHALER] 18 each 0    Sig: TAKE 2 PUFFS BY MOUTH EVERY 6 HOURS AS NEEDED FOR WHEEZE OR SHORTNESS OF BREATH     Pulmonology:  Beta Agonists 2 Failed - 08/11/2021 12:47 PM      Failed - Last BP in normal range    BP Readings from Last 1 Encounters:  07/09/21 (!) 142/89         Passed - Last Heart Rate in normal range    Pulse Readings from Last 1 Encounters:  07/09/21 98         Passed - Valid encounter within last 12 months    Recent Outpatient Visits          2 months ago Moderate persistent asthma with exacerbation   [facility] [name], MD   4 months ago Iron deficiency anemia, unspecified iron deficiency anemia type   [facility] [name], MD   8 months ago Iron deficiency anemia, unspecified iron deficiency anemia type   [facility] [name], [name], MD   9 months ago Microcytic anemia   [facility] [name], [name], MD   1 year ago Wheezing   [facility] [name], NP

## 2021-10-06 ENCOUNTER — Other Ambulatory Visit: Payer: Self-pay | Admitting: Family Medicine

## 2021-10-06 ENCOUNTER — Other Ambulatory Visit: Payer: Self-pay | Admitting: Adult Health

## 2021-10-06 DIAGNOSIS — R053 Chronic cough: Secondary | ICD-10-CM

## 2021-10-07 NOTE — Telephone Encounter (Signed)
Requested Prescriptions  Pending Prescriptions Disp Refills  . VENTOLIN HFA 108 (90 Base) MCG/ACT inhaler [Pharmacy Med Name: VENTOLIN HFA 90 MCG INHALER] 18 each 0    Sig: INHALE 2 PUFFS INTO THE LUNGS EVERY 6 HOURS AS NEEDED FOR WHEEZE OR SHORTNESS OF BREATH     Pulmonology:  Beta Agonists 2 Failed - 10/06/2021  1:09 PM      Failed - Last BP in normal range    BP Readings from Last 1 Encounters:  07/09/21 (!) 142/89         Passed - Last Heart Rate in normal range    Pulse Readings from Last 1 Encounters:  07/09/21 98         Passed - Valid encounter within last 12 months    Recent Outpatient Visits          4 months ago Moderate persistent asthma with exacerbation   [facility] [name], MD   6 months ago Iron deficiency anemia, unspecified iron deficiency anemia type   [facility] [name], MD   10 months ago Iron deficiency anemia, unspecified iron deficiency anemia type   [facility] [name], [name], MD   11 months ago Microcytic anemia   [facility] [name], [name], MD   1 year ago Wheezing   [facility] [name], NP

## 2021-10-11 ENCOUNTER — Telehealth: Payer: Self-pay | Admitting: Family Medicine

## 2021-10-11 MED ORDER — LO LOESTRIN FE 1 MG-10 MCG / 10 MCG PO TABS: 1.0000 | ORAL_TABLET | Freq: Every day | ORAL | 3 refills | Status: DC

## 2021-10-11 NOTE — Telephone Encounter (Signed)
Pt aware refills were sent in. Pt voiced understanding. JSY

## 2021-10-11 NOTE — Telephone Encounter (Signed)
Patient needs refill on   Norethindrone-Ethinyl Estradiol-Fe Biphas (LO LOESTRIN FE) 1 MG-10 MCG / 10 MCG tablet

## 2021-10-11 NOTE — Addendum Note (Signed)
Addended by: Cyril Mourning A on: 10/11/2021 03:36 PM   Modules accepted: Orders

## 2021-10-11 NOTE — Telephone Encounter (Signed)
Refilled lo loestrin x 3

## 2021-10-11 NOTE — Telephone Encounter (Signed)
Pt is needing a refill on her Lo Loestrin. She has an appt in September. Thanks! JSY

## 2021-10-22 ENCOUNTER — Inpatient Hospital Stay (HOSPITAL_COMMUNITY): Payer: Medicaid Other | Attending: Hematology

## 2021-10-22 DIAGNOSIS — D509 Iron deficiency anemia, unspecified: Secondary | ICD-10-CM | POA: Insufficient documentation

## 2021-10-22 DIAGNOSIS — D500 Iron deficiency anemia secondary to blood loss (chronic): Secondary | ICD-10-CM

## 2021-10-22 LAB — IRON AND TIBC
Iron: 123 ug/dL (ref 28–170)
Saturation Ratios: 22 % (ref 10.4–31.8)
TIBC: 557 ug/dL — ABNORMAL HIGH (ref 250–450)
UIBC: 434 ug/dL

## 2021-10-22 LAB — FERRITIN: Ferritin: 4 ng/mL — ABNORMAL LOW (ref 11–307)

## 2021-10-22 LAB — CBC WITH DIFFERENTIAL/PLATELET
Abs Immature Granulocytes: 0.04 10*3/uL (ref 0.00–0.07)
Basophils Absolute: 0.1 10*3/uL (ref 0.0–0.1)
Basophils Relative: 1 %
Eosinophils Absolute: 0.7 10*3/uL — ABNORMAL HIGH (ref 0.0–0.5)
Eosinophils Relative: 7 %
HCT: 35.1 % — ABNORMAL LOW (ref 36.0–46.0)
Hemoglobin: 9.9 g/dL — ABNORMAL LOW (ref 12.0–15.0)
Immature Granulocytes: 0 %
Lymphocytes Relative: 17 %
Lymphs Abs: 1.7 10*3/uL (ref 0.7–4.0)
MCH: 21.6 pg — ABNORMAL LOW (ref 26.0–34.0)
MCHC: 28.2 g/dL — ABNORMAL LOW (ref 30.0–36.0)
MCV: 76.6 fL — ABNORMAL LOW (ref 80.0–100.0)
Monocytes Absolute: 0.5 10*3/uL (ref 0.1–1.0)
Monocytes Relative: 5 %
Neutro Abs: 7.1 10*3/uL (ref 1.7–7.7)
Neutrophils Relative %: 70 %
Platelets: 339 10*3/uL (ref 150–400)
RBC: 4.58 MIL/uL (ref 3.87–5.11)
RDW: 14 % (ref 11.5–15.5)
WBC: 10.1 10*3/uL (ref 4.0–10.5)
nRBC: 0 % (ref 0.0–0.2)

## 2021-10-28 NOTE — Progress Notes (Signed)
Virtual Visit via Telephone Note [facility]  I connected with Heidi Clark  on 10/29/2021 at 11:45 AM by telephone and verified that I am speaking with the correct person using two identifiers.  Location: Patient: Home Provider: [facility]   I discussed the limitations, risks, security and privacy concerns of performing an evaluation and management service by telephone and the availability of in person appointments. I also discussed with the patient that there may be a patient responsible charge related to this service. The patient expressed understanding and agreed to proceed.  REASON FOR VISIT:  Follow-up for iron deficiency anemia   PREVIOUS THERAPY: Daily iron supplementation, PRBC transfusion x2 (10/27/2020)    CURRENT THERAPY: Intermittent IV iron infusions  INTERVAL HISTORY: Heidi Clark is contacted today for follow-up of her iron deficiency anemia.  She was last evaluated via telemedicine visit by Rojelio Brenner PA-C on 06/22/2021.  Most recent IV iron was April 2023. She reports that she has improved energy after her IV iron. She denies any bright blood per rectum or melena.    Previous notes by hematology MD as well as PCP report that she has history of heavy periods (menses lasting 4 to 5 days, at times needing to change pad every 30 minutes).  Patient denies having ever had heavy periods, but she does sometimes give conflicting histories, which has been noted by myself and other providers in the past.  At present, she reports that she has not had a period for "a while," secondary to hormonal birth control. She denies any pica, restless legs, headaches, chest pain, dyspnea exertion, lightheadedness, or syncope   She has 100% energy and 100% appetite. She endorses that she is maintaining a stable weight.    OBSERVATIONS/OBJECTIVE: Review of Systems  Constitutional:  Negative for chills, diaphoresis, fever, malaise/fatigue and  weight loss.  Respiratory:  Negative for cough and shortness of breath.   Cardiovascular:  Negative for chest pain and palpitations.  Gastrointestinal:  Negative for abdominal pain, blood in stool, melena, nausea and vomiting.  Neurological:  Negative for dizziness and headaches.    PHYSICAL EXAM (per limitations of virtual telephone visit): The patient is alert and oriented x 3.  She has a flat voice affect and offers monosyllabic answers to questions.  ASSESSMENT & PLAN: 1.  Iron deficiency anemia - Previous notes by hematology MD as well as PCP report that she has history of heavy periods (menses lasting 4 to 5 days, at times needing to change pad every 30 minutes).  Patient denies having ever had heavy periods, but she does sometimes give conflicting histories, which has been noted by myself and other providers in the past.  At present, she reports that she has not had a period for "a while," secondary to hormonal birth control.  - Severe anemia noted on 10/27/2020 with Hgb 6.7, received 2 units PRBC blood transfusion. - Hemoccult negative in August 2022 and April 2023 - Failed to improve on oral iron supplement and multivitamin - Most recent IV iron with Venofer 300 mg x 3 in April 2023 - Denies any bright red blood per rectum or melena   - Most recent labs (10/22/2021): Hgb 9.9/MCV 76.6, ferritin 4, iron saturation 22% with TIBC 557 - We have suspected iron deficiency anemia secondary to menstrual blood loss, but patient adamantly denies any recent menstrual cycle.  However, her hemoglobin has dropped significantly and her iron levels remain low despite IV iron repletion.  This is concerning for occult blood loss despite Hemoccult negative stool. - PLAN: Recommend additional IV iron with Venofer 300 mg x 3. - We will refer to GI for work-up of possible occult gastrointestinal bleeding - Repeat labs and RTC in 3 months with PHONE visit    I discussed the assessment and treatment plan with  the patient. The patient was provided an opportunity to ask questions and all were answered. The patient agreed with the plan and demonstrated an understanding of the instructions.   The patient was advised to call back or seek an in-person evaluation if the symptoms worsen or if the condition fails to improve as anticipated.  I provided 22 minutes of non-face-to-face time during this encounter.   Carnella Guadalajara, PA-C 10/29/2021 2:18 PM

## 2021-10-29 ENCOUNTER — Inpatient Hospital Stay (HOSPITAL_BASED_OUTPATIENT_CLINIC_OR_DEPARTMENT_OTHER): Payer: Medicaid Other | Admitting: Physician Assistant

## 2021-10-29 DIAGNOSIS — D500 Iron deficiency anemia secondary to blood loss (chronic): Secondary | ICD-10-CM

## 2021-11-03 ENCOUNTER — Encounter: Payer: Self-pay | Admitting: *Deleted

## 2021-11-22 ENCOUNTER — Telehealth: Payer: Self-pay
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
## 2021-11-23 ENCOUNTER — Encounter: Payer: Self-pay | Admitting: Women's Health

## 2021-11-23 ENCOUNTER — Ambulatory Visit (INDEPENDENT_AMBULATORY_CARE_PROVIDER_SITE_OTHER): Payer: Medicaid Other | Admitting: Women's Health

## 2021-11-23 VITALS — BP 127/75 | HR 113 | Ht 60.0 in | Wt 285.0 lb

## 2021-11-23 DIAGNOSIS — E282 Polycystic ovarian syndrome: Secondary | ICD-10-CM | POA: Diagnosis not present

## 2021-11-23 DIAGNOSIS — Z01419 Encounter for gynecological examination (general) (routine) without abnormal findings: Secondary | ICD-10-CM

## 2021-11-23 DIAGNOSIS — Z0000 Encounter for general adult medical examination without abnormal findings: Secondary | ICD-10-CM | POA: Diagnosis not present

## 2021-11-23 NOTE — Progress Notes (Signed)
WELL-WOMAN EXAMINATION Patient name: Heidi Clark MRN [id number]  Date of birth: 05-Apr-1990 Chief Complaint:   Annual Exam  History of Present Illness:   Heidi Clark is a 31 y.o. G0P0 Caucasian female being seen today for a routine well-woman exam.  Current complaints: none. Lives w/ parents. Doesn't work. Not sexually active. On LoLoestrin for PCOS, has light period monthly. Has IDA, has had PRBC x 2 in 2022, daily po iron supplementation and IV iron, managed by [facility]. Last visit 10/29/21, next appt in Nov. Has upcoming GI appt.   PCP: [facility] w/ PCP and [facility] No LMP recorded. (Menstrual status: Oral contraceptives). The current method of family planning is abstinence and OCP (estrogen/progesterone).  Last pap 09/02/19. Results were:  benign reactive/reparative changes, cellular changes c/w hyperkeratosis . H/O abnormal pap: no Last mammogram: never. Results were: N/A. Family h/o breast cancer: no Last colonoscopy: never. Results were: N/A. Family h/o colorectal cancer: no     11/23/2021    3:45 PM 12/01/2020    8:14 AM 10/20/2020    9:14 AM 04/02/2020   12:06 PM 09/02/2019    3:23 PM  Depression screen PHQ 2/9  Decreased Interest 0 0 0 0 0  Down, Depressed, Hopeless 0 0 0 0 0  PHQ - 2 Score 0 0 0 0 0  Altered sleeping 0  0  0  Tired, decreased energy 0  0  0  Change in appetite 0  0  0  Feeling bad or failure about yourself  0  0  0  Trouble concentrating 0  0  0  Moving slowly or fidgety/restless 0  0  0  Suicidal thoughts 0  0  0  PHQ-9 Score 0  0  0        11/23/2021    3:46 PM 10/20/2020    9:16 AM 09/02/2019    3:24 PM  GAD 7 : Generalized Anxiety Score  Nervous, Anxious, on Edge 0 0 0  Control/stop worrying 0 0 0  Worry too much - different things 0 0 0  Trouble relaxing 0 0 0  Restless 0 0 0  Easily annoyed or irritable 0 0 0  Afraid - awful might happen 0 0 0  Total GAD 7 Score 0 0 0     Review of Systems:   Pertinent items are noted in  HPI Denies any headaches, blurred vision, fatigue, shortness of breath, chest pain, abdominal pain, abnormal vaginal discharge/itching/odor/irritation, problems with periods, bowel movements, urination, or intercourse unless otherwise stated above. Pertinent History Reviewed:  Reviewed past medical,surgical, social and family history.  Reviewed problem list, medications and allergies. Physical Assessment:   Vitals:   11/23/21 1539  BP: 127/75  Pulse: (!) 113  Weight: 285 lb (129.3 kg)  Height: 5' (1.524 m)  Body mass index is 55.66 kg/m.        Physical Examination:   General appearance - well appearing, and in no distress  Mental status - alert, oriented to person, place, and time  Psych:  She has a normal mood and affect  Skin - warm and dry, normal color, no suspicious lesions noted  Chest - effort normal, all lung fields clear to auscultation bilaterally  Heart - normal rate and regular rhythm  Neck:  midline trachea, no thyromegaly or nodules  Breasts - breasts appear normal, no suspicious masses, no skin or nipple changes or  axillary nodes  Abdomen - soft, nontender,  nondistended, no masses or organomegaly  Pelvic - difficult exam d/t discomfort w/ speculum, VULVA: normal appearing vulva with no masses, tenderness or lesions  VAGINA: normal appearing vagina with normal color and discharge, no lesions  CERVIX: normal appearing cervix without discharge or lesions, no CMT  Thin prep pap is not done   UTERUS: unable to palpate adequately d/t body habitus  ADNEXA: unable to palpate adequately d/t body habitus  Extremities:  No swelling or varicosities noted  Chaperone: Latisha Cresenzo    No results found for this or any previous visit (from the past 24 hour(s)).  Assessment & Plan:  1) Well-Woman Exam  2) PCOS> well managed on LoLoestrin, having light monthly periods  3) IDA> managed by [facility], has upcoming appt w/ GI  Labs/procedures today: exam  Mammogram: @ 29yo, or  sooner if problems Colonoscopy: @ 29yo, or sooner if problems  No orders of the defined types were placed in this encounter.   Meds: No orders of the defined types were placed in this encounter.   Follow-up: Return in about 1 year (around 11/24/2022) for Physical.  Cheral Marker CNM, [facility] 11/23/2021 4:29 PM

## 2021-11-25 ENCOUNTER — Ambulatory Visit: Payer: Medicaid Other | Admitting: Internal Medicine

## 2021-11-30 ENCOUNTER — Inpatient Hospital Stay: Payer: Medicaid Other | Attending: Hematology

## 2021-11-30 VITALS — BP 139/82 | HR 96 | Temp 97.0°F | Resp 20

## 2021-11-30 DIAGNOSIS — Z79899 Other long term (current) drug therapy: Secondary | ICD-10-CM | POA: Insufficient documentation

## 2021-11-30 DIAGNOSIS — D509 Iron deficiency anemia, unspecified: Secondary | ICD-10-CM | POA: Insufficient documentation

## 2021-11-30 DIAGNOSIS — D500 Iron deficiency anemia secondary to blood loss (chronic): Secondary | ICD-10-CM

## 2021-11-30 MED ORDER — SODIUM CHLORIDE 0.9 % IV SOLN
300.0000 mg | Freq: Once | INTRAVENOUS | Status: AC
  Administered 2021-11-30: 300 mg via INTRAVENOUS
  Filled 2021-11-30: qty 300

## 2021-11-30 MED ORDER — LORATADINE 10 MG PO TABS
10.0000 mg | ORAL_TABLET | Freq: Once | ORAL | Status: AC
  Administered 2021-11-30: 10 mg via ORAL
  Filled 2021-11-30: qty 1

## 2021-11-30 MED ORDER — ACETAMINOPHEN 325 MG PO TABS
650.0000 mg | ORAL_TABLET | Freq: Once | ORAL | Status: AC
  Administered 2021-11-30: 650 mg via ORAL
  Filled 2021-11-30: qty 2

## 2021-11-30 MED ORDER — SODIUM CHLORIDE 0.9 % IV SOLN: Freq: Once | INTRAVENOUS | Status: AC

## 2021-11-30 NOTE — Progress Notes (Signed)
Patient tolerated iron infusion with no complaints voiced.  Peripheral IV site clean and dry with good blood return noted before and after infusion.  Band aid applied.  VSS with discharge and left in satisfactory condition with no s/s of distress noted.   

## 2021-11-30 NOTE — Patient Instructions (Signed)
[facility] [place]  Discharge Instructions: Thank you for choosing [facility] to provide your oncology and hematology care.  If you have a lab appointment with the Cancer Center, please come in thru the Main Entrance and check in at the main information desk.  Wear comfortable clothing and clothing appropriate for easy access to any Portacath or PICC line.   We strive to give you quality time with your provider. You may need to reschedule your appointment if you arrive late (15 or more minutes).  Arriving late affects you and other patients whose appointments are after yours.  Also, if you miss three or more appointments without notifying the office, you may be dismissed from the clinic at the provider's discretion.      For prescription refill requests, have your pharmacy contact our office and allow 72 hours for refills to be completed.    Today you received the following Venofer, return as scheduled.   To help prevent nausea and vomiting after your treatment, we encourage you to take your nausea medication as directed.  BELOW ARE SYMPTOMS THAT SHOULD BE REPORTED IMMEDIATELY: *FEVER GREATER THAN 100.4 F (38 C) OR HIGHER *CHILLS OR SWEATING *NAUSEA AND VOMITING THAT IS NOT CONTROLLED WITH YOUR NAUSEA MEDICATION *UNUSUAL SHORTNESS OF BREATH *UNUSUAL BRUISING OR BLEEDING *URINARY PROBLEMS (pain or burning when urinating, or frequent urination) *BOWEL PROBLEMS (unusual diarrhea, constipation, pain near the anus) TENDERNESS IN MOUTH AND THROAT WITH OR WITHOUT PRESENCE OF ULCERS (sore throat, sores in mouth, or a toothache) UNUSUAL RASH, SWELLING OR PAIN  UNUSUAL VAGINAL DISCHARGE OR ITCHING   Items with * indicate a potential emergency and should be followed up as soon as possible or go to the Emergency Department if any problems should occur.  Please show the CHEMOTHERAPY ALERT CARD or IMMUNOTHERAPY ALERT CARD at check-in to the Emergency Department and triage  nurse.  Should you have questions after your visit or need to cancel or reschedule your appointment, please contact [facility] [place] [phone]  and follow the prompts.  Office hours are 8:00 a.m. to 4:30 p.m. Monday - Friday. Please note that voicemails left after 4:00 p.m. may not be returned until the following business day.  We are closed weekends and major holidays. You have access to a nurse at all times for urgent questions. Please call the main number to the clinic [phone] and follow the prompts.  For any non-urgent questions, you may also contact your provider using MyChart. We now offer e-Visits for anyone 18 and older to request care online for non-urgent symptoms. For details visit [email or web address].   Also download the MyChart app! Go to the app store, search "MyChart", open the app, select [place], and log in with your MyChart username and password.  Masks are optional in the cancer centers. If you would like for your care team to wear a mask while they are taking care of you, please let them know. You may have one support person who is at least 29 years old accompany you for your appointments.  

## 2021-12-01 ENCOUNTER — Ambulatory Visit: Payer: Medicaid Other | Admitting: Internal Medicine

## 2021-12-07 ENCOUNTER — Inpatient Hospital Stay: Payer: Medicaid Other

## 2021-12-14 ENCOUNTER — Inpatient Hospital Stay: Payer: Medicaid Other

## 2021-12-17 ENCOUNTER — Ambulatory Visit: Payer: Medicaid Other | Admitting: Gastroenterology

## 2021-12-17 NOTE — Progress Notes (Deleted)
GI Office Note    Referring Provider: Rojelio Brenner, PA-C Primary Care Physician:  [name], MD  Primary Gastroenterologist:  Chief Complaint   No chief complaint on file.    History of Present Illness   Heidi Clark is a 31 y.o. female presenting today at the request of Rojelio Brenner, PA-C for further evaluation of iron deficiency anemia.  Previously felt to be due to heavy menses but reported light menses for several months after starting hormonal birth control.  Hemoccult negative stool August 2022 (X1) and April 2023 (x2).  Has been receiving IV iron.  Even after lightened menses, she continued to have trended downward from her iron levels.   August 2022: Hemoglobin 6.7, iron 14, TIBC 531, ferritin 15 November 2020: Hemoglobin 12.9, iron 147, TIBC 596, iron saturations 25%, ferritin 24 May 2021: Hemoglobin 13.5, iron 101, TIBC 514, iron saturation 20%, ferritin 14 October 22, 2021: Hemoglobin 9.9, iron 123, TIBC 557, iron saturation 22%, ferritin 4     Medications   Current Outpatient Medications  Medication Sig Dispense Refill   amLODipine (NORVASC) 5 MG tablet TAKE 1 TABLET (5 MG TOTAL) BY MOUTH DAILY. 90 tablet 4   budesonide-formoterol (SYMBICORT) 160-4.5 MCG/ACT inhaler Inhale 2 puffs into the lungs 2 (two) times daily. 1 each 3   cetirizine (ZYRTEC) 10 MG tablet Take 1 tablet (10 mg total) by mouth daily. 30 tablet 3   Cholecalciferol 5000 units capsule Take 1 capsule (5,000 Units total) by mouth daily.     ferrous sulfate 325 (65 FE) MG tablet TAKE 1 TABLET BY MOUTH TWICE A DAY 180 tablet 1   fluticasone (FLONASE) 50 MCG/ACT nasal spray SPRAY 2 SPRAYS INTO EACH NOSTRIL EVERY DAY 16 mL 6   mometasone (ELOCON) 0.1 % cream APPLY TO AFFECTED AREA TOPICALLY EVERY DAY 15 g 1   Norethindrone-Ethinyl Estradiol-Fe Biphas (LO LOESTRIN FE) 1 MG-10 MCG / 10 MCG tablet Take 1 tablet by mouth daily. 28 tablet 3   omeprazole (PRILOSEC) 20 MG capsule  Take 1 capsule (20 mg total) by mouth daily. 30 capsule 3   predniSONE (DELTASONE) 20 MG tablet 3 tabs poqday 1-2, 2 tabs poqday 3-4, 1 tab poqday 5-6 12 tablet 0   VENTOLIN HFA 108 (90 Base) MCG/ACT inhaler INHALE 2 PUFFS INTO THE LUNGS EVERY 6 HOURS AS NEEDED FOR WHEEZE OR SHORTNESS OF BREATH 18 each 0   vitamin B-12 (CYANOCOBALAMIN) 1000 MCG tablet Take 1 tablet (1,000 mcg total) by mouth daily.     No current facility-administered medications for this visit.    Allergies   Allergies as of 12/17/2021 - Review Complete 11/30/2021  Allergen Reaction Noted   No known allergies  08/11/2016    Past Medical History   Past Medical History:  Diagnosis Date   ADHD (attention deficit hyperactivity disorder)    ADHD (attention deficit hyperactivity disorder)    Elevated BP without diagnosis of hypertension 09/02/2019   Encounter for menstrual regulation 09/25/2015   GERD (gastroesophageal reflux disease)    Headache(784.0)    Insulin resistance    Obesity    Polycystic ovarian syndrome    Rash and nonspecific skin eruption 02/27/2020    Past Surgical History   Past Surgical History:  Procedure Laterality Date   Right hip     Age 62   TOOTH EXTRACTION N/A 08/12/2016   Procedure: DENTAL RESTORATION/EXTRACTIONS;  Surgeon: Ocie Doyne, DDS;  Location: [facility] OR;  Service: Oral Surgery;  Laterality: N/A;  Past Family History   Family History  Problem Relation Age of Onset   Diabetes Mother    Obesity Mother    Colon cancer Neg Hx     Past Social History   Social History   Socioeconomic History   Marital status: Single    Spouse name: Not on file   Number of children: Not on file   Years of education: Not on file   Highest education level: Not on file  Occupational History   Not on file  Tobacco Use   Smoking status: Never   Smokeless tobacco: Never  Vaping Use   Vaping Use: Never used  Substance and Sexual Activity   Alcohol use: No   Drug use: No   Sexual  activity: Never    Birth control/protection: Other-see comments, Pill    Comment: CELIBATE  Other Topics Concern   Not on file  Social History Narrative   Not on file   Social Determinants of Health   Financial Resource Strain: Low Risk  (10/20/2020)   Overall Financial Resource Strain (CARDIA)    Difficulty of Paying Living Expenses: Not hard at all  Food Insecurity: Food Insecurity Present (10/20/2020)   Hunger Vital Sign    Worried About [place] in the Last Year: Sometimes true    Ran Out of Food in the Last Year: Sometimes true  Transportation Needs: No Transportation Needs (10/20/2020)   PRAPARE - Hydrologist (Medical): No    Lack of Transportation (Non-Medical): No  Physical Activity: Inactive (10/20/2020)   Exercise Vital Sign    Days of Exercise per Week: 0 days    Minutes of Exercise per Session: 0 min  Stress: No Stress Concern Present (10/20/2020)   [place]    Feeling of Stress : Not at all  Social Connections: Socially Isolated (10/20/2020)   Social Connection and Isolation Panel [NHANES]    Frequency of Communication with Friends and Family: Never    Frequency of Social Gatherings with Friends and Family: Never    Attends Religious Services: Never    Marine scientist or Organizations: No    Attends Archivist Meetings: Never    Marital Status: Never married  Intimate Partner Violence: Not At Risk (10/20/2020)   Humiliation, Afraid, Rape, and Kick questionnaire    Fear of Current or Ex-Partner: No    Emotionally Abused: No    Physically Abused: No    Sexually Abused: No    Review of Systems   General: Negative for anorexia, weight loss, fever, chills, fatigue, weakness. Eyes: Negative for vision changes.  ENT: Negative for hoarseness, difficulty swallowing , nasal congestion. CV: Negative for chest pain, angina, palpitations, dyspnea on exertion,  peripheral edema.  Respiratory: Negative for dyspnea at rest, dyspnea on exertion, cough, sputum, wheezing.  GI: See history of present illness. GU:  Negative for dysuria, hematuria, urinary incontinence, urinary frequency, nocturnal urination.  MS: Negative for joint pain, low back pain.  Derm: Negative for rash or itching.  Neuro: Negative for weakness, abnormal sensation, seizure, frequent headaches, memory loss,  confusion.  Psych: Negative for anxiety, depression, suicidal ideation, hallucinations.  Endo: Negative for unusual weight change.  Heme: Negative for bruising or bleeding. Allergy: Negative for rash or hives.  Physical Exam   There were no vitals taken for this visit.   General: Well-nourished, well-developed in no acute distress.  Head: Normocephalic, atraumatic.  Eyes: Conjunctiva pink, no icterus. Mouth: Oropharyngeal mucosa moist and pink , no lesions erythema or exudate. Neck: Supple without thyromegaly, masses, or lymphadenopathy.  Lungs: Clear to auscultation bilaterally.  Heart: Regular rate and rhythm, no murmurs rubs or gallops.  Abdomen: Bowel sounds are normal, nontender, nondistended, no hepatosplenomegaly or masses,  no abdominal bruits or hernia, no rebound or guarding.   Rectal: *** Extremities: No lower extremity edema. No clubbing or deformities.  Neuro: Alert and oriented x 4 , grossly normal neurologically.  Skin: Warm and dry, no rash or jaundice.   Psych: Alert and cooperative, normal mood and affect.  Labs   *** Imaging Studies   No results found.  Assessment       PLAN   ***   Leanna Battles. Melvyn Neth, MHS, PA-C [facility]

## 2021-12-28 ENCOUNTER — Inpatient Hospital Stay: Payer: Medicaid Other | Attending: Hematology

## 2021-12-28 VITALS — BP 141/96 | HR 108 | Temp 98.4°F | Resp 18

## 2021-12-28 DIAGNOSIS — Z79899 Other long term (current) drug therapy: Secondary | ICD-10-CM | POA: Insufficient documentation

## 2021-12-28 DIAGNOSIS — D500 Iron deficiency anemia secondary to blood loss (chronic): Secondary | ICD-10-CM

## 2021-12-28 DIAGNOSIS — D509 Iron deficiency anemia, unspecified: Secondary | ICD-10-CM | POA: Diagnosis present

## 2021-12-28 MED ORDER — SODIUM CHLORIDE 0.9 % IV SOLN: Freq: Once | INTRAVENOUS | Status: AC

## 2021-12-28 MED ORDER — ACETAMINOPHEN 325 MG PO TABS
650.0000 mg | ORAL_TABLET | Freq: Once | ORAL | Status: AC
  Administered 2021-12-28: 650 mg via ORAL
  Filled 2021-12-28: qty 2

## 2021-12-28 MED ORDER — LORATADINE 10 MG PO TABS
10.0000 mg | ORAL_TABLET | Freq: Once | ORAL | Status: AC
  Administered 2021-12-28: 10 mg via ORAL
  Filled 2021-12-28: qty 1

## 2021-12-28 MED ORDER — SODIUM CHLORIDE 0.9 % IV SOLN
300.0000 mg | Freq: Once | INTRAVENOUS | Status: AC
  Administered 2021-12-28: 300 mg via INTRAVENOUS
  Filled 2021-12-28: qty 300

## 2021-12-28 NOTE — Progress Notes (Signed)
Venofer infusion given per orders. Patient tolerated it well without problems. Vitals stable and discharged home from clinic ambulatory. Follow up as scheduled.  

## 2021-12-28 NOTE — Patient Instructions (Signed)
[facility] [place]  Discharge Instructions: Thank you for choosing [facility] to provide your oncology and hematology care.  If you have a lab appointment with the Cancer Center, please come in thru the Main Entrance and check in at the main information desk.  Wear comfortable clothing and clothing appropriate for easy access to any Portacath or PICC line.   We strive to give you quality time with your provider. You may need to reschedule your appointment if you arrive late (15 or more minutes).  Arriving late affects you and other patients whose appointments are after yours.  Also, if you miss three or more appointments without notifying the office, you may be dismissed from the clinic at the provider's discretion.      For prescription refill requests, have your pharmacy contact our office and allow 72 hours for refills to be completed.    Today you received the following, Venofer infusion   To help prevent nausea and vomiting after your treatment, we encourage you to take your nausea medication as directed.  BELOW ARE SYMPTOMS THAT SHOULD BE REPORTED IMMEDIATELY: *FEVER GREATER THAN 100.4 F (38 C) OR HIGHER *CHILLS OR SWEATING *NAUSEA AND VOMITING THAT IS NOT CONTROLLED WITH YOUR NAUSEA MEDICATION *UNUSUAL SHORTNESS OF BREATH *UNUSUAL BRUISING OR BLEEDING *URINARY PROBLEMS (pain or burning when urinating, or frequent urination) *BOWEL PROBLEMS (unusual diarrhea, constipation, pain near the anus) TENDERNESS IN MOUTH AND THROAT WITH OR WITHOUT PRESENCE OF ULCERS (sore throat, sores in mouth, or a toothache) UNUSUAL RASH, SWELLING OR PAIN  UNUSUAL VAGINAL DISCHARGE OR ITCHING   Items with * indicate a potential emergency and should be followed up as soon as possible or go to the Emergency Department if any problems should occur.  Please show the CHEMOTHERAPY ALERT CARD or IMMUNOTHERAPY ALERT CARD at check-in to the Emergency Department and triage  nurse.  Should you have questions after your visit or need to cancel or reschedule your appointment, please contact [facility] [place] [phone]  and follow the prompts.  Office hours are 8:00 a.m. to 4:30 p.m. Monday - Friday. Please note that voicemails left after 4:00 p.m. may not be returned until the following business day.  We are closed weekends and major holidays. You have access to a nurse at all times for urgent questions. Please call the main number to the clinic [phone] and follow the prompts.  For any non-urgent questions, you may also contact your provider using MyChart. We now offer e-Visits for anyone 18 and older to request care online for non-urgent symptoms. For details visit [email or web address].   Also download the MyChart app! Go to the app store, search "MyChart", open the app, select [place], and log in with your MyChart username and password.  Masks are optional in the cancer centers. If you would like for your care team to wear a mask while they are taking care of you, please let them know. You may have one support person who is at least 29 years old accompany you for your appointments.  

## 2022-01-11 ENCOUNTER — Ambulatory Visit: Payer: Medicaid Other

## 2022-01-18 ENCOUNTER — Ambulatory Visit: Payer: Medicaid Other | Admitting: Gastroenterology

## 2022-01-24 ENCOUNTER — Other Ambulatory Visit: Payer: Medicaid Other

## 2022-01-31 ENCOUNTER — Telehealth: Payer: Medicaid Other | Admitting: Physician Assistant

## 2022-02-04 ENCOUNTER — Other Ambulatory Visit: Payer: Self-pay | Admitting: Adult Health

## 2022-02-22 ENCOUNTER — Ambulatory Visit: Payer: Medicaid Other

## 2022-02-22 ENCOUNTER — Ambulatory Visit: Payer: Medicaid Other | Admitting: Gastroenterology

## 2022-03-16 ENCOUNTER — Inpatient Hospital Stay: Payer: Medicaid Other | Attending: Hematology

## 2022-03-16 VITALS — BP 124/74 | HR 88 | Temp 98.5°F | Resp 18

## 2022-03-16 DIAGNOSIS — D509 Iron deficiency anemia, unspecified: Secondary | ICD-10-CM | POA: Diagnosis present

## 2022-03-16 DIAGNOSIS — Z79899 Other long term (current) drug therapy: Secondary | ICD-10-CM | POA: Diagnosis not present

## 2022-03-16 DIAGNOSIS — D500 Iron deficiency anemia secondary to blood loss (chronic): Secondary | ICD-10-CM

## 2022-03-16 MED ORDER — LORATADINE 10 MG PO TABS
10.0000 mg | ORAL_TABLET | Freq: Once | ORAL | Status: AC
  Administered 2022-03-16: 10 mg via ORAL
  Filled 2022-03-16: qty 1

## 2022-03-16 MED ORDER — SODIUM CHLORIDE 0.9 % IV SOLN: Freq: Once | INTRAVENOUS | Status: AC

## 2022-03-16 MED ORDER — ACETAMINOPHEN 325 MG PO TABS
650.0000 mg | ORAL_TABLET | Freq: Once | ORAL | Status: AC
  Administered 2022-03-16: 650 mg via ORAL
  Filled 2022-03-16: qty 2

## 2022-03-16 MED ORDER — SODIUM CHLORIDE 0.9 % IV SOLN
300.0000 mg | Freq: Once | INTRAVENOUS | Status: AC
  Administered 2022-03-16: 300 mg via INTRAVENOUS
  Filled 2022-03-16: qty 300

## 2022-03-16 NOTE — Progress Notes (Signed)
Pt presents today for Venofer IV iron infusion per provider's order. Vital signs stable and pt voiced no new complaints at this time.  Peripheral IV started with good blood return pre and post infusion.  Venofer 300 mg  given today per MD orders. Tolerated infusion without adverse affects. Vital signs stable. No complaints at this time. Discharged from clinic ambulatory in stable condition. Alert and oriented x 3. F/U with [facility] as scheduled.   

## 2022-03-16 NOTE — Patient Instructions (Signed)
[facility]  Discharge Instructions: Thank you for choosing [facility] to provide your oncology and hematology care.  If you have a lab appointment with the Cancer Center, please come in thru the Main Entrance and check in at the main information desk.  Wear comfortable clothing and clothing appropriate for easy access to any Portacath or PICC line.   We strive to give you quality time with your provider. You may need to reschedule your appointment if you arrive late (15 or more minutes).  Arriving late affects you and other patients whose appointments are after yours.  Also, if you miss three or more appointments without notifying the office, you may be dismissed from the clinic at the provider's discretion.      For prescription refill requests, have your pharmacy contact our office and allow 72 hours for refills to be completed.    Today you received Venofer IV iron infusion.     BELOW ARE SYMPTOMS THAT SHOULD BE REPORTED IMMEDIATELY: *FEVER GREATER THAN 100.4 F (38 C) OR HIGHER *CHILLS OR SWEATING *NAUSEA AND VOMITING THAT IS NOT CONTROLLED WITH YOUR NAUSEA MEDICATION *UNUSUAL SHORTNESS OF BREATH *UNUSUAL BRUISING OR BLEEDING *URINARY PROBLEMS (pain or burning when urinating, or frequent urination) *BOWEL PROBLEMS (unusual diarrhea, constipation, pain near the anus) TENDERNESS IN MOUTH AND THROAT WITH OR WITHOUT PRESENCE OF ULCERS (sore throat, sores in mouth, or a toothache) UNUSUAL RASH, SWELLING OR PAIN  UNUSUAL VAGINAL DISCHARGE OR ITCHING   Items with * indicate a potential emergency and should be followed up as soon as possible or go to the Emergency Department if any problems should occur.  Please show the CHEMOTHERAPY ALERT CARD or IMMUNOTHERAPY ALERT CARD at check-in to the Emergency Department and triage nurse.  Should you have questions after your visit or need to cancel or reschedule your appointment, please contact [facility] [phone]  and follow the prompts.  Office hours are 8:00 a.m. to 4:30 p.m. Monday - Friday. Please note that voicemails left after 4:00 p.m. may not be returned until the following business day.  We are closed weekends and major holidays. You have access to a nurse at all times for urgent questions. Please call the main number to the clinic [phone] and follow the prompts.  For any non-urgent questions, you may also contact your provider using MyChart. We now offer e-Visits for anyone 18 and older to request care online for non-urgent symptoms. For details visit [email or web address].   Also download the MyChart app! Go to the app store, search "MyChart", open the app, select [facility], and log in with your MyChart username and password.   

## 2022-03-22 ENCOUNTER — Other Ambulatory Visit: Payer: Medicaid Other

## 2022-03-22 ENCOUNTER — Telehealth: Payer: Medicaid Other | Admitting: Physician Assistant

## 2022-03-25 ENCOUNTER — Inpatient Hospital Stay: Payer: Medicaid Other

## 2022-03-25 DIAGNOSIS — D500 Iron deficiency anemia secondary to blood loss (chronic): Secondary | ICD-10-CM

## 2022-03-25 DIAGNOSIS — D509 Iron deficiency anemia, unspecified: Secondary | ICD-10-CM | POA: Diagnosis not present

## 2022-03-25 LAB — CBC WITH DIFFERENTIAL/PLATELET
Abs Immature Granulocytes: 0.02 10*3/uL (ref 0.00–0.07)
Basophils Absolute: 0 10*3/uL (ref 0.0–0.1)
Basophils Relative: 0 %
Eosinophils Absolute: 0.3 10*3/uL (ref 0.0–0.5)
Eosinophils Relative: 3 %
HCT: 45.3 % (ref 36.0–46.0)
Hemoglobin: 13.8 g/dL (ref 12.0–15.0)
Immature Granulocytes: 0 %
Lymphocytes Relative: 18 %
Lymphs Abs: 1.8 10*3/uL (ref 0.7–4.0)
MCH: 23.5 pg — ABNORMAL LOW (ref 26.0–34.0)
MCHC: 30.5 g/dL (ref 30.0–36.0)
MCV: 77.3 fL — ABNORMAL LOW (ref 80.0–100.0)
Monocytes Absolute: 0.5 10*3/uL (ref 0.1–1.0)
Monocytes Relative: 5 %
Neutro Abs: 7.5 10*3/uL (ref 1.7–7.7)
Neutrophils Relative %: 74 %
Platelets: 370 10*3/uL (ref 150–400)
RBC: 5.86 MIL/uL — ABNORMAL HIGH (ref 3.87–5.11)
RDW: 17.2 % — ABNORMAL HIGH (ref 11.5–15.5)
WBC: 10 10*3/uL (ref 4.0–10.5)
nRBC: 0 % (ref 0.0–0.2)

## 2022-03-25 LAB — FERRITIN: Ferritin: 115 ng/mL (ref 11–307)

## 2022-03-25 LAB — IRON AND TIBC
Iron: 61 ug/dL (ref 28–170)
Saturation Ratios: 12 % (ref 10.4–31.8)
TIBC: 497 ug/dL — ABNORMAL HIGH (ref 250–450)
UIBC: 436 ug/dL

## 2022-03-29 ENCOUNTER — Encounter: Payer: Self-pay | Admitting: Family Medicine

## 2022-03-29 ENCOUNTER — Ambulatory Visit: Payer: Medicaid Other | Admitting: Family Medicine

## 2022-03-29 VITALS — BP 120/88 | HR 93 | Temp 97.5°F | Ht 61.0 in | Wt 298.0 lb

## 2022-03-29 DIAGNOSIS — L03012 Cellulitis of left finger: Secondary | ICD-10-CM | POA: Insufficient documentation

## 2022-03-29 MED ORDER — SULFAMETHOXAZOLE-TRIMETHOPRIM 800-160 MG PO TABS: 1.0000 | ORAL_TABLET | Freq: Two times a day (BID) | ORAL | 0 refills | Status: DC

## 2022-03-29 NOTE — Progress Notes (Signed)
Acute Office Visit  Subjective:     Patient ID: Heidi Clark, female    DOB: 08-19-1990, 32 y.o.   MRN: [id number]  Chief Complaint  Patient presents with   Acute Visit    L hand hurting and swollen near thumb (sx since Sat. 03/26/22) - JBG\\\    HPI Patient is in today for left hand swelling and pain for 4 days. She reports she woke up Sunday with a sore on her left index finger that was worse on Monday. She denies drainage, fever, malaise, chills, body aches, nausea, vomiting.  Review of Systems  All other systems reviewed and are negative.   Past Medical History:  Diagnosis Date   ADHD (attention deficit hyperactivity disorder)    ADHD (attention deficit hyperactivity disorder)    Elevated BP without diagnosis of hypertension 09/02/2019   Encounter for menstrual regulation 09/25/2015   GERD (gastroesophageal reflux disease)    Headache(784.0)    Insulin resistance    Obesity    Polycystic ovarian syndrome    Rash and nonspecific skin eruption 02/27/2020   Past Surgical History:  Procedure Laterality Date   Right hip     Age 41   TOOTH EXTRACTION N/A 08/12/2016   Procedure: DENTAL RESTORATION/EXTRACTIONS;  Surgeon: Diona Browner, DDS;  Location: [facility];  Service: Oral Surgery;  Laterality: N/A;   Current Outpatient Medications on File Prior to Visit  Medication Sig Dispense Refill   amLODipine (NORVASC) 5 MG tablet TAKE 1 TABLET (5 MG TOTAL) BY MOUTH DAILY. 90 tablet 4   budesonide-formoterol (SYMBICORT) 160-4.5 MCG/ACT inhaler Inhale 2 puffs into the lungs 2 (two) times daily. 1 each 3   cetirizine (ZYRTEC) 10 MG tablet Take 1 tablet (10 mg total) by mouth daily. 30 tablet 3   Cholecalciferol 5000 units capsule Take 1 capsule (5,000 Units total) by mouth daily.     ferrous sulfate 325 (65 FE) MG tablet TAKE 1 TABLET BY MOUTH TWICE A DAY 180 tablet 1   fluticasone (FLONASE) 50 MCG/ACT nasal spray SPRAY 2 SPRAYS INTO EACH NOSTRIL EVERY DAY 16 mL 6   LO LOESTRIN FE 1  MG-10 MCG / 10 MCG tablet TAKE 1 TABLET BY MOUTH EVERY DAY 28 tablet 12   mometasone (ELOCON) 0.1 % cream APPLY TO AFFECTED AREA TOPICALLY EVERY DAY 15 g 1   omeprazole (PRILOSEC) 20 MG capsule Take 1 capsule (20 mg total) by mouth daily. 30 capsule 3   predniSONE (DELTASONE) 20 MG tablet 3 tabs poqday 1-2, 2 tabs poqday 3-4, 1 tab poqday 5-6 12 tablet 0   VENTOLIN HFA 108 (90 Base) MCG/ACT inhaler INHALE 2 PUFFS INTO THE LUNGS EVERY 6 HOURS AS NEEDED FOR WHEEZE OR SHORTNESS OF BREATH 18 each 0   vitamin B-12 (CYANOCOBALAMIN) 1000 MCG tablet Take 1 tablet (1,000 mcg total) by mouth daily.     No current facility-administered medications on file prior to visit.   Allergies  Allergen Reactions   No Known Allergies        Objective:    BP 120/88   Pulse 93   Temp (!) 97.5 F (36.4 C) (Oral)   Ht 5\' 1"  (1.549 m)   Wt 298 lb (135.2 kg)   LMP  (Exact Date)   SpO2 98%   BMI 56.31 kg/m    Physical Exam Vitals and nursing note reviewed.  Constitutional:      Appearance: Normal appearance. She is obese.  HENT:     Head: Normocephalic and atraumatic.  Skin:    General: Skin is warm and dry.     Findings: Abscess (left index finger) present.  Neurological:     General: No focal deficit present.     Mental Status: She is alert and oriented to person, place, and time. Mental status is at baseline.  Psychiatric:        Mood and Affect: Mood normal.        Behavior: Behavior normal.        Thought Content: Thought content normal.        Judgment: Judgment normal.    I & D  Date/Time: 03/29/2022 10:59 AM  Performed by: Rubie Maid, FNP Authorized by: Rubie Maid, FNP   Consent:    Consent obtained:  Verbal   Consent given by:  Patient and parent   Risks, benefits, and alternatives were discussed: yes     Risks discussed:  Bleeding, incomplete drainage, pain and infection   Alternatives discussed:  No treatment Location:    Type:  Abscess   Size:  0.5cm x 0.5cm    Location:  Upper extremity   Upper extremity location:  Finger   Finger location:  L index finger Pre-procedure details:    Skin preparation:  Povidone-iodine Sedation:    Sedation type:  None Anesthesia:    Anesthesia method:  Nerve block   Block anesthetic:  Lidocaine 2% w/o epi   Block injection procedure:  Anatomic landmarks identified, introduced needle, negative aspiration for blood and incremental injection   Block outcome:  Anesthesia achieved Procedure type:    Complexity:  Simple Procedure details:    Needle aspiration: no     Incision types:  Stab incision   Incision depth:  Dermal   Wound management:  Probed and deloculated   Drainage:  Purulent and bloody   Drainage amount:  Moderate   Packing materials:  None Post-procedure details:    Procedure completion:  Tolerated well, no immediate complications    No results found for any visits on 03/29/22.      Assessment & Plan:   Problem List Items Addressed This Visit       Musculoskeletal and Integument   Paronychia of left index finger - Primary    Patient presented today with purulent abscess to medial aspect of her left index finger with surrounding painful erythema and edema. I&D was performed without complication. Start Bactrim BID for 5 days. Instructed on aftercare. Instructed to return to office for swelling, redness, yellow discharge, fever/chills/body aches, malaise, nausea/vomiting.      Relevant Medications   sulfamethoxazole-trimethoprim (BACTRIM DS) 800-160 MG tablet    Meds ordered this encounter  Medications   sulfamethoxazole-trimethoprim (BACTRIM DS) 800-160 MG tablet    Sig: Take 1 tablet by mouth 2 (two) times daily.    Dispense:  10 tablet    Refill:  0    Order Specific Question:   Supervising Provider    Answer:   Jenna Luo T [[id number]]    Return if symptoms worsen or fail to improve.  Rubie Maid, FNP

## 2022-03-29 NOTE — Assessment & Plan Note (Signed)
Patient presented today with purulent abscess to medial aspect of her left index finger with surrounding painful erythema and edema. I&D was performed without complication. Start Bactrim BID for 5 days. Instructed on aftercare. Instructed to return to office for swelling, redness, yellow discharge, fever/chills/body aches, malaise, nausea/vomiting.

## 2022-05-11 NOTE — Progress Notes (Signed)
Patient cancelled follow up and is to call to reschedule.

## 2022-09-26 ENCOUNTER — Ambulatory Visit: Payer: Medicaid Other | Admitting: Adult Health

## 2022-09-26 ENCOUNTER — Other Ambulatory Visit (HOSPITAL_COMMUNITY)
Admission: RE | Admit: 2022-09-26 | Discharge: 2022-09-26 | Disposition: A | Discharge status: Home / Self Care | Payer: Medicaid Other | Source: Ambulatory Visit | Attending: Adult Health | Admitting: Adult Health

## 2022-09-26 ENCOUNTER — Encounter: Payer: Self-pay | Admitting: Adult Health

## 2022-09-26 VITALS — BP 144/91 | HR 104 | Ht 61.0 in | Wt 303.0 lb

## 2022-09-26 DIAGNOSIS — Z124 Encounter for screening for malignant neoplasm of cervix: Secondary | ICD-10-CM | POA: Insufficient documentation

## 2022-09-26 DIAGNOSIS — E282 Polycystic ovarian syndrome: Secondary | ICD-10-CM | POA: Diagnosis not present

## 2022-09-26 DIAGNOSIS — I10 Essential (primary) hypertension: Secondary | ICD-10-CM | POA: Diagnosis not present

## 2022-09-26 DIAGNOSIS — Z7689 Persons encountering health services in other specified circumstances: Secondary | ICD-10-CM

## 2022-09-26 MED ORDER — AMLODIPINE BESYLATE 5 MG PO TABS: 5.0000 mg | ORAL_TABLET | Freq: Every day | ORAL | 4 refills | Status: DC

## 2022-09-26 MED ORDER — LO LOESTRIN FE 1 MG-10 MCG / 10 MCG PO TABS: 1.0000 | ORAL_TABLET | Freq: Every day | ORAL | 12 refills | Status: DC

## 2022-09-26 NOTE — Progress Notes (Signed)
  Subjective:     Patient ID: Heidi Clark, female   DOB: [birth year], 32 y.o.   MRN: [id number]  HPI Heidi Clark is a 32 year old white female,single, G0P0, in for refills on lo loestrin to manage periods, she says she may not have a period every month. She needs a pap.  PCP is Dr [name]  Review of Systems May miss a period on lo Loestrin She has never had sex  Reviewed past medical,surgical, social and family history. Reviewed medications and allergies.     Objective:   Physical Exam BP (!) 144/91 (BP Location: Left Arm, Patient Position: Sitting, Cuff Size: Normal)   Pulse (!) 104   Ht 5\' 1"  (1.549 m)   Wt (!) 303 lb (137.4 kg)   LMP 09/16/2022   BMI 57.25 kg/m     Skin warm and dry.  Lungs: clear to ausculation bilaterally. Cardiovascular: regular rate and rhythm. Part line in hair peeling, got sun burned at the pool, was using sun screen she says. Pelvic: external genitalia is normal in appearance no lesions, vagina: pink,urethra has no lesions or masses noted, cervix:was not totally visualized, pap with HR HPV genotyping performed, uterus: felt to be normal size, shape and contour, non tender, no masses felt, adnexa: no masses or tenderness noted. Bladder is non tender and no masses felt, but difficult exam due to body habitus. Fall risk Is low  [facility] - 09/26/22 1122       Pregnancy Intention Screening   Does the patient want to become pregnant in the next year? No    Does the patient's partner want to become pregnant in the next year? No    Would the patient like to discuss contraceptive options today? No      Contraception Wrap Up   Current Method Abstinence;Oral Contraceptive    End Method Abstinence;Oral Contraceptive            Examination chaperoned by Malachy Mood LPN  Assessment:     1. Routine Papanicolaou smear Pap sent Pap in 3 years if normal GYN exam in 1 year Labs with PCP or Dr Kirtland Bouchard - Cytology - PAP( [place])  2. Encounter for menstrual  regulation Will miss a period occasionally Will refill lo Loestrin  Meds ordered this encounter  Medications   amLODipine (NORVASC) 5 MG tablet    Sig: Take 1 tablet (5 mg total) by mouth daily.    Dispense:  90 tablet    Refill:  4    Order Specific Question:   Supervising Provider    Answer:   Duane Lope H [[id number]]   Norethindrone-Ethinyl Estradiol-Fe Biphas (LO LOESTRIN FE) 1 MG-10 MCG / 10 MCG tablet    Sig: Take 1 tablet by mouth daily.    Dispense:  28 tablet    Refill:  12    Order Specific Question:   Supervising Provider    Answer:   Despina Hidden, LUTHER H [[id number]]     3. Hypertension, unspecified type She did not take BP pill today Will refill Norvasc 5 mg 1 daily Decrease salt and sugars Follow up with PCP  4. Morbid obesity (HCC) Try to be more active Decrease carbs  5. Polycystic ovarian syndrome     Plan:     Follow up in 1 year or sooner if needed

## 2022-09-28 LAB — CYTOLOGY - PAP
Adequacy: ABSENT
Comment: NEGATIVE
Diagnosis: NEGATIVE
High risk HPV: NEGATIVE

## 2023-04-07 ENCOUNTER — Ambulatory Visit: Payer: Self-pay | Admitting: Family Medicine

## 2023-04-07 NOTE — Telephone Encounter (Addendum)
Copied from CRM [id number]. Topic: Clinical - Red Word Triage >> Apr 07, 2023  9:04 AM Geroge Baseman wrote: Red Word that prompted transfer to Nurse Triage: pain especially when she picks things up, and it started after christmas getting worse and worse  Chief Complaint: R shoulder and elbow pain Frequency: Ongoing Since December and worsened in early January Pertinent Negatives: Patient denies chest pain Disposition: [] ED /[] Urgent Care (no appt availability in office) / [x] Appointment(In office/virtual)/ []  [facility]/ [] Home Care/ [] Refused Recommended Disposition /[] [facility]/ []  Follow-up with PCP Additional Notes: Patient stated she has been having right shoulder and elbow pain since December. The pain occurs daily and it worsened in early January. No known or suspected cause. Appointment scheduled for 1/28. OTC remedies for pain relief recommended in the meantime.    Reason for Disposition  Shoulder pain is a chronic symptom (recurrent or ongoing AND present > 4 weeks)  Answer Assessment - Initial Assessment Questions 1. ONSET: "When did the pain start?"     Sometime in December, got worse beginning of January   2. LOCATION: "Where is the pain located?"     R shoulder and elbow   3. PAIN: "How bad is the pain?" (Scale 1-10; or mild, moderate, severe)   - MILD (1-3): doesn't interfere with normal activities   - MODERATE (4-7): interferes with normal activities (e.g., work or school) or awakens from sleep   - SEVERE (8-10): excruciating pain, unable to do any normal activities, unable to move arm at all due to pain Patient stated 10/10, however she is still able able to lift and move her arm and do activities. She stated there is some weakness and difficulty in picking up things.  4. CAUSE: "What do you think is causing the shoulder pain?"     Unknown  5. OTHER SYMPTOMS: "Do you have any other symptoms?" (e.g., neck pain, swelling, rash, fever, numbness,  weakness)     Weakness in same arm  Protocols used: Shoulder Pain-A-AH

## 2023-04-11 ENCOUNTER — Ambulatory Visit: Payer: Medicaid Other | Admitting: Family Medicine

## 2023-04-14 ENCOUNTER — Ambulatory Visit: Payer: Medicaid Other | Admitting: Family Medicine

## 2023-05-04 ENCOUNTER — Other Ambulatory Visit: Payer: Self-pay | Admitting: Adult Health

## 2023-06-26 ENCOUNTER — Emergency Department (HOSPITAL_COMMUNITY)

## 2023-06-26 ENCOUNTER — Other Ambulatory Visit: Payer: Self-pay

## 2023-06-26 ENCOUNTER — Ambulatory Visit: Payer: Self-pay

## 2023-06-26 ENCOUNTER — Emergency Department (HOSPITAL_COMMUNITY)
Admission: EM | Admit: 2023-06-26 | Discharge: 2023-06-26 | Disposition: A | Discharge status: Home / Self Care | Attending: Emergency Medicine | Admitting: Emergency Medicine

## 2023-06-26 DIAGNOSIS — R0602 Shortness of breath: Secondary | ICD-10-CM | POA: Insufficient documentation

## 2023-06-26 DIAGNOSIS — R059 Cough, unspecified: Secondary | ICD-10-CM | POA: Insufficient documentation

## 2023-06-26 DIAGNOSIS — R062 Wheezing: Secondary | ICD-10-CM

## 2023-06-26 DIAGNOSIS — R0600 Dyspnea, unspecified: Secondary | ICD-10-CM

## 2023-06-26 DIAGNOSIS — Z79899 Other long term (current) drug therapy: Secondary | ICD-10-CM | POA: Insufficient documentation

## 2023-06-26 DIAGNOSIS — J45909 Unspecified asthma, uncomplicated: Secondary | ICD-10-CM | POA: Insufficient documentation

## 2023-06-26 DIAGNOSIS — I10 Essential (primary) hypertension: Secondary | ICD-10-CM | POA: Insufficient documentation

## 2023-06-26 DIAGNOSIS — R053 Chronic cough: Secondary | ICD-10-CM

## 2023-06-26 DIAGNOSIS — J45901 Unspecified asthma with (acute) exacerbation: Secondary | ICD-10-CM

## 2023-06-26 LAB — CBC WITH DIFFERENTIAL/PLATELET
Abs Immature Granulocytes: 0.03 10*3/uL (ref 0.00–0.07)
Basophils Absolute: 0.1 10*3/uL (ref 0.0–0.1)
Basophils Relative: 1 %
Eosinophils Absolute: 0.2 10*3/uL (ref 0.0–0.5)
Eosinophils Relative: 1 %
HCT: 44.4 % (ref 36.0–46.0)
Hemoglobin: 14.6 g/dL (ref 12.0–15.0)
Immature Granulocytes: 0 %
Lymphocytes Relative: 11 %
Lymphs Abs: 1.1 10*3/uL (ref 0.7–4.0)
MCH: 28.7 pg (ref 26.0–34.0)
MCHC: 32.9 g/dL (ref 30.0–36.0)
MCV: 87.2 fL (ref 80.0–100.0)
Monocytes Absolute: 0.3 10*3/uL (ref 0.1–1.0)
Monocytes Relative: 3 %
Neutro Abs: 8.8 10*3/uL — ABNORMAL HIGH (ref 1.7–7.7)
Neutrophils Relative %: 84 %
Platelets: 283 10*3/uL (ref 150–400)
RBC: 5.09 MIL/uL (ref 3.87–5.11)
RDW: 14 % (ref 11.5–15.5)
WBC: 10.4 10*3/uL (ref 4.0–10.5)
nRBC: 0 % (ref 0.0–0.2)

## 2023-06-26 LAB — HCG, SERUM, QUALITATIVE: Preg, Serum: NEGATIVE

## 2023-06-26 LAB — BASIC METABOLIC PANEL WITH GFR
Anion gap: 13 (ref 5–15)
BUN: 10 mg/dL (ref 6–20)
CO2: 20 mmol/L — ABNORMAL LOW (ref 22–32)
Calcium: 8.6 mg/dL — ABNORMAL LOW (ref 8.9–10.3)
Chloride: 105 mmol/L (ref 98–111)
Creatinine, Ser: 0.59 mg/dL (ref 0.44–1.00)
GFR, Estimated: >60 mL/min (ref >60)
Glucose, Bld: 183 mg/dL — ABNORMAL HIGH (ref 70–99)
Potassium: 3.4 mmol/L — ABNORMAL LOW (ref 3.5–5.1)
Sodium: 138 mmol/L (ref 135–145)

## 2023-06-26 LAB — RESP PANEL BY RT-PCR (RSV, FLU A&B, COVID)  RVPGX2
Influenza A by PCR: NEGATIVE
Influenza B by PCR: NEGATIVE
Resp Syncytial Virus by PCR: NEGATIVE
SARS Coronavirus 2 by RT PCR: NEGATIVE

## 2023-06-26 LAB — D-DIMER, QUANTITATIVE: D-Dimer, Quant: <0.27 ug{FEU}/mL (ref 0.00–0.50)

## 2023-06-26 MED ORDER — BUDESONIDE-FORMOTEROL FUMARATE 160-4.5 MCG/ACT IN AERO: 2.0000 | INHALATION_SPRAY | Freq: Two times a day (BID) | RESPIRATORY_TRACT | 3 refills | Status: AC

## 2023-06-26 MED ORDER — ALBUTEROL SULFATE HFA 108 (90 BASE) MCG/ACT IN AERS: 1.0000 | INHALATION_SPRAY | Freq: Four times a day (QID) | RESPIRATORY_TRACT | 1 refills | Status: AC | PRN

## 2023-06-26 MED ORDER — ALBUTEROL SULFATE (2.5 MG/3ML) 0.083% IN NEBU
10.0000 mg/h | INHALATION_SOLUTION | RESPIRATORY_TRACT | Status: AC
  Administered 2023-06-26: 10 mg/h via RESPIRATORY_TRACT
  Filled 2023-06-26: qty 12

## 2023-06-26 MED ORDER — PREDNISONE 20 MG PO TABS: 40.0000 mg | ORAL_TABLET | Freq: Every day | ORAL | 0 refills | Status: AC

## 2023-06-26 MED ORDER — MAGNESIUM SULFATE 2 GM/50ML IV SOLN
2.0000 g | Freq: Once | INTRAVENOUS | Status: AC
  Administered 2023-06-26: 2 g via INTRAVENOUS
  Filled 2023-06-26: qty 50

## 2023-06-26 MED ORDER — SODIUM CHLORIDE 0.9 % IV BOLUS
1000.0000 mL | Freq: Once | INTRAVENOUS | Status: AC
  Administered 2023-06-26: 1000 mL via INTRAVENOUS

## 2023-06-26 NOTE — Discharge Instructions (Signed)
 As discussed, your workup today was reassuring.  Chest x-ray did not show evidence of pneumonia or other abnormality.  Suspect bronchitis versus asthma exacerbation.  Will place you on steroids for the next few days.  Continues inhaler at home.  Recommend follow-up with your primary care for reassessment.  Please do not hesitate to return if the worrisome signs and symptoms we discussed become apparent.

## 2023-06-26 NOTE — ED Provider Notes (Signed)
  EMERGENCY DEPARTMENT AT [facility] Provider Note   CSN: [id number] Arrival date & time: 06/26/23  1417     History  Chief Complaint  Patient presents with   Shortness of Breath    Heidi Clark is a 33 y.o. female.   Shortness of Breath   33 year old female presents to the emergency department with complaints of shortness of breath, cough.  States that she has had cough for the past few weeks with shortness of breath experienced yesterday.  States she has an inhaler at home given by her primary care that she rarely uses.  States that she only uses it when she is ill and feels like she is wheezing which does not occur on a monthly basis.  Denies any fevers, chills, chest pain, nausea, vomiting.  States that she tried to use her inhaler earlier this morning when she fell like she was wheezing without improvement prompting calling EMS.  Patient was given Atrovent, albuterol, Solu-Medrol with improvement of symptoms subjectively.  Patient states that she feels like she is wheezing still.  Denies any abdominal pain, fevers, nausea, vomiting urinary symptoms, change in bowel habits.  Denies any history of DVT/PE, recent surgery/immobilization, known coagulopathy, known malignancy.  Patient is on oral birth control that she has been on for about a year.  Past medical history significant for hypertension, headache, ADHD, GERD, obesity, asthma  Home Medications Prior to Admission medications   Medication Sig Start Date End Date Taking? Authorizing Provider  amLODipine (NORVASC) 5 MG tablet Take 1 tablet (5 mg total) by mouth daily. 09/26/22   Javan Messing, NP  budesonide-formoterol (SYMBICORT) 160-4.5 MCG/ACT inhaler Inhale 2 puffs into the lungs 2 (two) times daily. 04/23/20   Wilhemena Harbour, NP  Cholecalciferol 5000 units capsule Take 1 capsule (5,000 Units total) by mouth daily. 10/03/15   Javan Messing, NP  ferrous sulfate 325 (65 FE) MG tablet TAKE  1 TABLET BY MOUTH TWICE A DAY 01/04/21   Austine Lefort, MD  fluticasone (FLONASE) 50 MCG/ACT nasal spray SPRAY 2 SPRAYS INTO EACH NOSTRIL EVERY DAY 04/07/21   Austine Lefort, MD  LO LOESTRIN FE 1 MG-10 MCG / 10 MCG tablet TAKE 1 TABLET BY MOUTH EVERY DAY 05/04/23   Lendia Quay A, NP  mometasone (ELOCON) 0.1 % cream APPLY TO AFFECTED AREA TOPICALLY EVERY DAY 06/16/21   Austine Lefort, MD  omeprazole (PRILOSEC) 20 MG capsule Take 1 capsule (20 mg total) by mouth daily. 03/19/20   Wilhemena Harbour, NP  VENTOLIN HFA 108 (90 Base) MCG/ACT inhaler INHALE 2 PUFFS INTO THE LUNGS EVERY 6 HOURS AS NEEDED FOR WHEEZE OR SHORTNESS OF BREATH 10/07/21   Austine Lefort, MD  vitamin B-12 (CYANOCOBALAMIN) 1000 MCG tablet Take 1 tablet (1,000 mcg total) by mouth daily. 10/30/20   Austine Lefort, MD      Allergies    No known allergies    Review of Systems   Review of Systems  Respiratory:  Positive for shortness of breath.   All other systems reviewed and are negative.   Physical Exam Updated Vital Signs BP (!) 149/134 (BP Location: Left Arm)   Pulse (!) 127   Temp 98 F (36.7 C) (Oral)   Resp 18   SpO2 96%  Physical Exam Vitals and nursing note reviewed.  Constitutional:      General: She is not in acute distress.    Appearance: She is well-developed.  HENT:  Head: Normocephalic and atraumatic.  Eyes:     Conjunctiva/sclera: Conjunctivae normal.  Cardiovascular:     Rate and Rhythm: Regular rhythm. Tachycardia present.     Heart sounds: No murmur heard. Pulmonary:     Effort: Pulmonary effort is normal. No respiratory distress.     Breath sounds: Wheezing present.  Abdominal:     Palpations: Abdomen is soft.     Tenderness: There is no abdominal tenderness.  Musculoskeletal:        General: No swelling.     Cervical back: Neck supple.     Right lower leg: No edema.     Left lower leg: No edema.  Skin:    General: Skin is warm and dry.     Capillary Refill:  Capillary refill takes less than 2 seconds.  Neurological:     Mental Status: She is alert.  Psychiatric:        Mood and Affect: Mood normal.    ED Results / Procedures / Treatments   Labs (all labs ordered are listed, but only abnormal results are displayed) Labs Reviewed  HCG, SERUM, QUALITATIVE  BASIC METABOLIC PANEL WITH GFR  CBC WITH DIFFERENTIAL/PLATELET    EKG None  Radiology No results found.  Procedures Procedures    Medications Ordered in ED Medications - No data to display  ED Course/ Medical Decision Making/ A&P Clinical Course as of 06/26/23 1530  Mon Jun 26, 2023  1519 Cough x 1 month [CR]    Clinical Course User Index [CR] [place] Butter, PA                                 Medical Decision Making Amount and/or Complexity of Data Reviewed Labs: ordered.  Risk Prescription drug management.   This patient presents to the ED for concern of cough, shortness of breath, this involves an extensive number of treatment options, and is a complaint that carries with it a high risk of complications and morbidity.  The differential diagnosis includes COVID, flu, RSV, asthma, COPD, bronchitis, PE, ACS, pneumothorax, anemia, pneumonia, other   Co morbidities that complicate the patient evaluation  See HPI   Additional history obtained:  Additional history obtained from EMR External records from outside source obtained and reviewed including hospital records   Lab Tests:  I Ordered, and personally interpreted labs.  The pertinent results include: No leukocytosis.  No evidence of anemia.  Platelets within range.  Mild hypokalemia 3.4 otherwise, electrolytes within limits.  No renal dysfunction.  D-dimer negative.  hCG negative.  Viral testing negative.   Imaging Studies ordered:  I ordered imaging studies including chest x-ray I independently visualized and interpreted imaging which showed no cardiopulmonary abnormality I agree with the  radiologist interpretation   Cardiac Monitoring: / EKG:  The patient was maintained on a cardiac monitor.  I personally viewed and interpreted the cardiac monitored which showed an underlying rhythm of: Sinus tachycardia with prolonged QT   Consultations Obtained:  N/a   Problem List / ED Course / Critical interventions / Medication management  Asthma exacerbation I ordered medication including albuterol, normal saline, magnesium sulfate  Reevaluation of the patient after these medicines showed that the patient improved I have reviewed the patients home medicines and have made adjustments as needed   Social Determinants of Health:  Denies tobacco, illicit drug use   Test / Admission - Considered:  Asthma exacerbation Vitals signs significant  for initial tachycardia, tachypnea which improved with albuterol breathing treatments, magnesium sulfate as well as meds administered by EMS prior to arrival. Otherwise within normal range and stable throughout visit. Laboratory/imaging studies significant for: See above 33 year old female presents to the emergency department with complaints of shortness of breath, cough.  States that she has had cough for the past few weeks with shortness of breath experienced yesterday.  States she has an inhaler at home given by her primary care that she rarely uses.  States that she only uses it when she is ill and feels like she is wheezing which does not occur on a monthly basis.  Denies any fevers, chills, chest pain, nausea, vomiting.  States that she tried to use her inhaler earlier this morning when she fell like she was wheezing without improvement prompting calling EMS.  Patient was given Atrovent, albuterol, Solu-Medrol with improvement of symptoms subjectively.  Patient states that she feels like she is wheezing still.  Denies any abdominal pain, fevers, nausea, vomiting urinary symptoms, change in bowel habits.  Denies any history of DVT/PE, recent  surgery/immobilization, known coagulopathy, known malignancy.  Patient is on oral birth control that she has been on for about a year. On exam, diffuse wheezing bilateral lung fields.  Work today reassuring.  Chest x-ray without evidence of pneumonia, pneumothorax or other acute cardiopulmonary abnormality.  D-dimer was ordered given patient's tachycardia in the setting of OCP use which was negative; low suspicion for PE.  Given patient's wheezing on exam with improvement with nebulized breathing treatments, suspect asthma exacerbation.  Will refill patient's inhalers at home as she only has albuterol rescue inhaler and states that it is out of date and they are empty.  Will also place patient on corticosteroids for the next few days.  Recommend follow-up with PCP in the outpatient setting for reassessment.  Treatment plan discussed with patient and she acknowledged understanding was agreeable to said plan.  Patient overall well-appearing, afebrile in no acute distress. Worrisome signs and symptoms were discussed with the patient, and the patient acknowledged understanding to return to the ED if noticed. Patient was stable upon discharge.          Final Clinical Impression(s) / ED Diagnoses Final diagnoses:  None    Rx / DC Orders ED Discharge Orders     None         [place]wood Butter, [place] 06/26/23 1936    Scarlette Currier, MD 06/27/23 1340

## 2023-06-26 NOTE — Telephone Encounter (Signed)
   Chief Complaint: SOB Symptoms: can't breathe  Disposition: [x] ED /[] Urgent Care (no appt availability in office) / [] Appointment(In office/virtual)/ []  [facility]/ [] Home Care/ [] Refused Recommended Disposition /[] [facility]/ []  Follow-up with PCP Additional Notes: pt called with complaints of "I can't breathe."  Pt stated she used inhaler "5 mins ago but it isn't working." Pt could not describe breathing feeling but was asked if she heaviness on chest and responded with "little bit." Pt denied chest pain. 911 was called and RN stayed on phone until EMS arrived.         Copied from CRM [id number]. Topic: Clinical - Red Word Triage >> Jun 26, 2023 12:53 PM Howard Macho wrote: Reason for CRM: patient stated she is having a hard time breathing Reason for Disposition  Wheezing started suddenly after medicine, an allergic food or bee sting  Answer Assessment - Initial Assessment Questions 1. RESPIRATORY STATUS: "Describe your breathing?" (e.g., wheezing, shortness of breath, unable to speak, severe coughing)      "Can't breath" 2. ONSET: "When did this breathing problem begin?"      yesterday 3. PATTERN "Does the difficult breathing come and go, or has it been constant since it started?"      Comes and goes 4. SEVERITY: "How bad is your breathing?" (e.g., mild, moderate, severe)    - MILD: No SOB at rest, mild SOB with walking, speaks normally in sentences, can lie down, no retractions, pulse < 100.    - MODERATE: SOB at rest, SOB with minimal exertion and prefers to sit, cannot lie down flat, speaks in phrases, mild retractions, audible wheezing, pulse 100-120.    - SEVERE: Very SOB at rest, speaks in single words, struggling to breathe, sitting hunched forward, retractions, pulse > 120      severe 5. RECURRENT SYMPTOM: "Have you had difficulty breathing before?" If Yes, ask: "When was the last time?" and "What happened that time?"      no 6. CARDIAC HISTORY: "Do  you have any history of heart disease?" (e.g., heart attack, angina, bypass surgery, angioplasty)      denies 7. LUNG HISTORY: "Do you have any history of lung disease?"  (e.g., pulmonary embolus, asthma, emphysema)     denies 8. CAUSE: "What do you think is causing the breathing problem?"      Not sure 9. OTHER SYMPTOMS: "Do you have any other symptoms? (e.g., dizziness, runny nose, cough, chest pain, fever)     Heaviness on chest 10. O2 SATURATION MONITOR:  "Do you use an oxygen saturation monitor (pulse oximeter) at home?" If Yes, ask: "What is your reading (oxygen level) today?" "What is your usual oxygen saturation reading?" (e.g., 95%)       na  Protocols used: Breathing Difficulty-A-AH

## 2023-06-26 NOTE — ED Triage Notes (Signed)
 Pt arrives via EMS co sob and exp wheezing that started when she woke up this morning. EMS gave 10 albuterol, 0.5 atrovent, 125mg  solumedrol en route with improvement in symptoms. O2 sats stable

## 2023-10-19 ENCOUNTER — Ambulatory Visit: Admitting: Adult Health

## 2023-10-19 ENCOUNTER — Encounter: Payer: Self-pay | Admitting: Adult Health

## 2023-10-19 VITALS — BP 118/76 | HR 101 | Ht 61.0 in | Wt 330.0 lb

## 2023-10-19 DIAGNOSIS — Z1331 Encounter for screening for depression: Secondary | ICD-10-CM | POA: Diagnosis not present

## 2023-10-19 DIAGNOSIS — Z7689 Persons encountering health services in other specified circumstances: Secondary | ICD-10-CM | POA: Diagnosis not present

## 2023-10-19 DIAGNOSIS — Z01419 Encounter for gynecological examination (general) (routine) without abnormal findings: Secondary | ICD-10-CM | POA: Diagnosis not present

## 2023-10-19 DIAGNOSIS — L304 Erythema intertrigo: Secondary | ICD-10-CM

## 2023-10-19 MED ORDER — CLOTRIMAZOLE-BETAMETHASONE 1-0.05 % EX CREA: 1.0000 | TOPICAL_CREAM | Freq: Two times a day (BID) | CUTANEOUS | 0 refills | Status: AC

## 2023-10-19 MED ORDER — NYSTATIN 100000 UNIT/GM EX POWD: 1.0000 | Freq: Two times a day (BID) | CUTANEOUS | 1 refills | Status: AC

## 2023-10-19 MED ORDER — LO LOESTRIN FE 1 MG-10 MCG / 10 MCG PO TABS
1.0000 | ORAL_TABLET | Freq: Every day | ORAL | 4 refills | Status: AC
Start: 2023-10-19 — End: ?

## 2023-10-19 NOTE — Progress Notes (Signed)
 Patient ID: Heidi Clark, female   DOB: Jul 09, 1990, 33 y.o.   MRN: [id number] History of Present Illness: Heidi Clark is a 33 year old white female, single, G0P0, in for well woman gyn exam, and refill lo loestrin .     Component Value Date/Time   DIAGPAP  09/26/2022 1123    - Negative for intraepithelial lesion or malignancy (NILM)   DIAGPAP - Benign reactive/reparative changes 09/02/2019 1539   DIAGPAP  10/12/2016 0000    NEGATIVE FOR INTRAEPITHELIAL LESIONS OR MALIGNANCY.   HPVHIGH Negative 09/26/2022 1123   HPVHIGH Negative 09/02/2019 1539   ADEQPAP  09/26/2022 1123    Satisfactory for evaluation; transformation zone component ABSENT.   ADEQPAP  09/02/2019 1539    Satisfactory for evaluation; transformation zone component PRESENT.   ADEQPAP  10/12/2016 0000    Satisfactory for evaluation  endocervical/transformation zone component PRESENT.   PCP is Dr Duanne   Current Medications, Allergies, Past Medical History, Past Surgical History, Family History and Social History were reviewed in [employer] electronic medical record.     Review of Systems: Patient denies any headaches, hearing loss, fatigue, blurred vision, shortness of breath, chest pain, abdominal pain, problems with bowel movements, urination, or intercourse.(Has never had sex). No joint pain or mood swings.  No period with lo Loestrin     Physical Exam:BP 118/76 (BP Location: Left Arm, Patient Position: Sitting, Cuff Size: Large)   Pulse (!) 101   Ht 5' 1 (1.549 m)   Wt (!) 330 lb (149.7 kg)   BMI 62.35 kg/m   General:  Well developed, well nourished, no acute distress Skin:  Warm and dry Neck:  Midline trachea, normal thyroid , good ROM, no lymphadenopathy Lungs; Clear to auscultation bilaterally Breast:  No dominant palpable mass, retraction, or nipple discharge Cardiovascular: Regular rate and rhythm Abdomen:  Soft, non tender, no hepatosplenomegaly Pelvic:  External genitalia is normal in appearance, has  redness in folds under panniculus with odor and in groin. (She has used A& D ointment). The vagina is normal in appearance. Urethra has no lesions or masses. The cervix is poorly seen.  Uterus is felt to be normal size, shape, and contour, but difficult exam due to abdominal girth.  No adnexal masses or tenderness noted.Bladder is non tender, no masses felt Extremities/musculoskeletal:  No swelling or varicosities noted, no clubbing or cyanosis Psych:  No mood changes, alert and cooperative,seems happy AA is 0 Fall risk is low    10/19/2023    3:54 PM 03/29/2022   10:59 AM 11/23/2021    3:45 PM  Depression screen PHQ 2/9  Decreased Interest 0 0 0  Down, Depressed, Hopeless 0 0 0  PHQ - 2 Score 0 0 0  Altered sleeping 0 0 0  Tired, decreased energy 0 0 0  Change in appetite 0 0 0  Feeling bad or failure about yourself  0 0 0  Trouble concentrating 0 0 0  Moving slowly or fidgety/restless 0 0 0  Suicidal thoughts 0 0 0  PHQ-9 Score 0 0 0       10/19/2023    3:54 PM 03/29/2022   10:59 AM 11/23/2021    3:46 PM 10/20/2020    9:16 AM  GAD 7 : Generalized Anxiety Score  Nervous, Anxious, on Edge 0 0 0 0  Control/stop worrying 0 0 0 0  Worry too much - different things 0 0 0 0  Trouble relaxing 0 0 0 0  Restless 0 0 0 0  Easily  annoyed or irritable 0 0 0 0  Afraid - awful might happen 0 0 0 0  Total GAD 7 Score 0 0 0 0      Upstream - 10/19/23 1553       Pregnancy Intention Screening   Does the patient want to become pregnant in the next year? No    Does the patient's partner want to become pregnant in the next year? No    Would the patient like to discuss contraceptive options today? No      Contraception Wrap Up   Current Method Abstinence;Oral Contraceptive    End Method Abstinence;Oral Contraceptive    Contraception Counseling Provided Yes          Examination chaperoned by Clarita Salt LPN  Impression and plan: 1. Encounter for well woman exam with routine gynecological  exam (Primary) Pap in 2027 Physical in 1 year Labs with PCP  2. Encounter for menstrual regulation No period with lo Loestrin  she says, will refill  Meds ordered this encounter  Medications   clotrimazole -betamethasone  (LOTRISONE ) cream    Sig: Apply 1 Application topically 2 (two) times daily. Use for 2 weeks only    Dispense:  30 g    Refill:  0    Supervising Provider:   JAYNE MINDER H [[id number]]   nystatin  (MYCOSTATIN /NYSTOP ) powder    Sig: Apply 1 Application topically 2 (two) times daily.    Dispense:  60 g    Refill:  1    Supervising Provider:   JAYNE MINDER H [[id number]]   Norethindrone -Ethinyl Estradiol -Fe Biphas (LO LOESTRIN FE ) 1 MG-10 MCG / 10 MCG tablet    Sig: Take 1 tablet by mouth daily.    Dispense:  84 tablet    Refill:  4    Supervising Provider:   JAYNE MINDER H [[id number]]     3. Morbid obesity (HCC)   4. Intertrigo + redness in folds under panniculus with odor and in groin.  Will rx Lotrisone  bid for 2 weeks Keep clean and dry  The use nystatin  powders If not better see me or PCP

## 2023-10-25 ENCOUNTER — Other Ambulatory Visit: Payer: Self-pay | Admitting: *Deleted

## 2023-11-22 ENCOUNTER — Encounter: Payer: Self-pay | Admitting: Family Medicine

## 2023-11-22 ENCOUNTER — Ambulatory Visit: Payer: Self-pay

## 2023-11-22 ENCOUNTER — Ambulatory Visit: Admitting: Family Medicine

## 2023-11-22 VITALS — BP 130/82 | HR 123 | Temp 98.6°F | Ht 61.0 in | Wt 336.4 lb

## 2023-11-22 DIAGNOSIS — R7309 Other abnormal glucose: Secondary | ICD-10-CM

## 2023-11-22 DIAGNOSIS — Z23 Encounter for immunization: Secondary | ICD-10-CM | POA: Diagnosis not present

## 2023-11-22 DIAGNOSIS — B372 Candidiasis of skin and nail: Secondary | ICD-10-CM | POA: Diagnosis not present

## 2023-11-22 MED ORDER — FLUCONAZOLE 150 MG PO TABS: 150.0000 mg | ORAL_TABLET | Freq: Once | ORAL | 1 refills | Status: AC

## 2023-11-22 MED ORDER — NYSTATIN-TRIAMCINOLONE 100000-0.1 UNIT/GM-% EX OINT
1.0000 | TOPICAL_OINTMENT | Freq: Two times a day (BID) | CUTANEOUS | 5 refills | Status: AC
Start: 2023-11-22 — End: ?

## 2023-11-22 NOTE — Progress Notes (Signed)
 Patient Office Visit  Assessment & Plan:  Intertriginous candidiasis -     Fluconazole ; Take 1 tablet (150 mg total) by mouth once for 1 dose.  Dispense: 10 tablet; Refill: 1 -     Nystatin -Triamcinolone ; Apply 1 Application topically 2 (two) times daily.  Dispense: 60 g; Refill: 5 -     Ambulatory referral to Dermatology  Elevated glucose -     Hemoglobin A1c  Need for influenza vaccination -     Flu vaccine trivalent PF, 6mos and older(Flulaval,Afluria,Fluarix,Fluzone)   Assessment and Plan    Candidiasis of skin in skin folds severe Severe candidiasis unresponsive to topical treatments, exacerbated by moisture and poor air circulation. Possible severe infection requiring further intervention. - Prescribe oral antifungal medication for one week. - Prescribe antifungal/steroid cream for topical application. - Consider dermatology referral if no improvement. - Discuss potential plastic surgery referral if dermatological treatment fails- she may be a surgical candidate due to chronic nature of this.   Abnormal blood glucose, not yet diagnosed as diabetes Abnormal glucose levels with a reading of 140, potential diabetes risk, possibly contributing to candidiasis. Family history and high sugar diet noted. - Check A1C today - Advise on dietary modifications to reduce sugar intake.          No follow-ups on file.   Subjective:    Patient ID: Heidi Clark, female    DOB: 1990/04/08  Age: 33 y.o. MRN: [id number]  Chief Complaint  Patient presents with   Rash    Pt c/o of a painful rash on both sides of her groin area extending to her genitals x 2 weeks.     Rash   Discussed the use of AI scribe software for clinical note transcription with the patient, who gave verbal consent to proceed.  History of Present Illness        Heidi Clark is a 33 year old female who presents with a persistent and severe yeast infection/intertrigone candidiasis.  She has been  experiencing a persistent and severe yeast infection for about two weeks under panniculus and groin areas. Despite using a steroid antifungal Lotrisone  cream and Nystatin  powder prescribed by her OB GYN on October 19, 2023, and nystatin  applied every morning and evening, there has been no relief. The condition temporarily improves but worsens again, particularly with sweating. She describes significant discomfort, avoiding wearing underwear due to pain. patient has had this before but not like this.   She denies any changes in soap or detergents and confirms she is not diabetic, although her sugar levels were noted to be elevated in April. Her family history includes diabetes, and she acknowledges consuming sweets, which are brought into the house by a family member.  No urinary problems are reported. Physical Exam SKIN: Erythematous and inflamed skin in the groin area and under panniculus. Results LABS Blood Glucose: 140 mg/dL (95/7974) Assessment & Plan Candidiasis of skin in skin folds severe Severe candidiasis unresponsive to topical treatments, exacerbated by moisture and poor air circulation. Possible severe infection requiring further intervention. - Prescribe oral antifungal medication for one week. - Prescribe antifungal/steroid cream for topical application. - Consider dermatology referral if no improvement. - Discuss potential plastic surgery referral if dermatological treatment fails- she may be a surgical candidate due to chronic nature of this.   Abnormal blood glucose, not yet diagnosed as diabetes Abnormal glucose levels with a reading of 140, potential diabetes risk, possibly contributing to candidiasis. Family history and high sugar diet  noted. - Check A1C today - Advise on dietary modifications to reduce sugar intake.    The ASCVD Risk score (Arnett DK, et al., 2019) failed to calculate for the following reasons:   The 2019 ASCVD risk score is only valid for ages 38 to  48  Past Medical History:  Diagnosis Date   ADHD (attention deficit hyperactivity disorder)    ADHD (attention deficit hyperactivity disorder)    Elevated BP without diagnosis of hypertension 09/02/2019   Encounter for menstrual regulation 09/25/2015   GERD (gastroesophageal reflux disease)    Headache(784.0)    Hypertension    Insulin resistance    Obesity    Polycystic ovarian syndrome    Rash and nonspecific skin eruption 02/27/2020   Past Surgical History:  Procedure Laterality Date   Right hip     Age 30   TOOTH EXTRACTION N/A 08/12/2016   Procedure: DENTAL RESTORATION/EXTRACTIONS;  Surgeon: Sheryle Hamilton, DDS;  Location: [facility] OR;  Service: Oral Surgery;  Laterality: N/A;   Social History   Tobacco Use   Smoking status: Never   Smokeless tobacco: Never  Vaping Use   Vaping status: Never Used  Substance Use Topics   Alcohol use: No   Drug use: No   Family History  Problem Relation Age of Onset   Diabetes Mother    Obesity Mother    Colon cancer Neg Hx    Allergies  Allergen Reactions   No Known Allergies     Review of Systems  Skin:  Positive for rash.      Objective:    BP 130/82   Pulse (!) 123   Temp 98.6 F (37 C)   Ht 5' 1 (1.549 m)   Wt (!) 336 lb 6 oz (152.6 kg)   SpO2 97%   BMI 63.56 kg/m  BP Readings from Last 3 Encounters:  11/22/23 130/82  10/19/23 118/76  06/26/23 (!) 158/78   Wt Readings from Last 3 Encounters:  11/22/23 (!) 336 lb 6 oz (152.6 kg)  10/19/23 (!) 330 lb (149.7 kg)  09/26/22 (!) 303 lb (137.4 kg)    Physical Exam Vitals and nursing note reviewed.  Constitutional:      General: She is not in acute distress.    Comments: Comes in with her father  HENT:     Head: Normocephalic and atraumatic.  Genitourinary:    Pubic Area: Rash present.     Labia:        Right: Rash present.        Left: Rash present.   Skin:    Findings: Erythema and rash present.     Comments: Extensive erythematous vesicular rash over  the entire groin area below the panniculus and into vaginal folds.  No drainage noted.  Patient does have tenderness to palpation.  Neurological:     Mental Status: She is alert.      No results found for any visits on 11/22/23.

## 2023-11-22 NOTE — Telephone Encounter (Signed)
 FYI Only or Action Required?: FYI only for provider.  Patient was last seen in primary care on 03/29/2022 by Kayla Jeoffrey RAMAN, FNP.  Called Nurse Triage reporting Rash.  Symptoms began several weeks ago.  Interventions attempted: Nothing.  Symptoms are: gradually worsening.  Triage Disposition: See PCP When Office is Open (Within 3 Days)  Patient/caregiver understands and will follow disposition?: Yes      Copied from CRM #[id number]. Topic: Clinical - Red Word Triage >> Nov 22, 2023  9:17 AM Charlet HERO wrote: Red Word that prompted transfer to Nurse Triage: Patient has a rash from her stomach , genital to thighs, it has been two weeks she is red and itching. No discharge. [name] [place] mother verneita is on the phone Reason for Disposition  Mild widespread rash  (Exception: Heat rash lasting 3 days or less.)  Answer Assessment - Initial Assessment Questions 1. APPEARANCE of RASH: What does the rash look like? (e.g., blisters, dry flaky skin, red spots, redness, sores)     Blood red, denies bumps 2. SIZE: How big are the spots? (e.g., tip of pen, eraser, coin; inches, centimeters)     Deneis spots 3. LOCATION: Where is the rash located?     Stomach and gential area, legs, vagina 4. COLOR: What color is the rash? (Note: It is difficult to assess rash color in people with darker-colored skin. When this situation occurs, simply ask the caller to describe what they see.)     Blood red 5. ONSET: When did the rash begin?     2 weeks ago 6. FEVER: Do you have a fever? If Yes, ask: What is your temperature, how was it measured, and when did it start?     no 7. ITCHING: Does the rash itch? If Yes, ask: How bad is the itch? (Scale 1-10; or mild, moderate, severe)     mild 8. CAUSE: What do you think is causing the rash?     unknown 9. MEDICINE FACTORS: Have you started any new medicines within the last 2 weeks? (e.g., antibiotics)      denies 10. OTHER  SYMPTOMS: Do you have any other symptoms? (e.g., dizziness, headache, sore throat, joint pain)       denies 11. PREGNANCY: Is there any chance you are pregnant? When was your last menstrual period?       na  Protocols used: Rash or Redness - [facility]

## 2023-11-23 ENCOUNTER — Ambulatory Visit: Payer: Self-pay | Admitting: Family Medicine

## 2023-11-23 LAB — HEMOGLOBIN A1C
Hgb A1c MFr Bld: 6.1 % — ABNORMAL HIGH (ref <5.7)
Mean Plasma Glucose: 128 mg/dL
eAG (mmol/L): 7.1 mmol/L

## 2023-11-27 ENCOUNTER — Other Ambulatory Visit (HOSPITAL_COMMUNITY): Payer: Self-pay

## 2023-11-27 ENCOUNTER — Telehealth: Payer: Self-pay | Admitting: Pharmacy Technician

## 2023-11-27 NOTE — Telephone Encounter (Signed)
 Pharmacy Patient Advocate Encounter   Received notification from Onbase that prior authorization for Nystatin -Triamcinolone  100000-0.1UNIT/GM-% ointment  is required/requested.   Insurance verification completed.   The patient is insured through [employer] .   Per test claim: PA required and submitted KEY/EOC/Request #: [id number] from 11/27/23 to 11/26/24. Ran test claim, Copay is $4.00. This test claim was processed through [facility]- copay amounts may vary at other pharmacies due to pharmacy/plan contracts, or as the patient moves through the different stages of their insurance plan.

## 2023-12-18 ENCOUNTER — Other Ambulatory Visit: Payer: Self-pay | Admitting: Adult Health

## 2024-03-05 ENCOUNTER — Ambulatory Visit: Admitting: Physician Assistant

## 2024-03-20 ENCOUNTER — Encounter: Payer: Self-pay | Admitting: Family Medicine

## 2024-03-20 ENCOUNTER — Ambulatory Visit: Admitting: Family Medicine

## 2024-03-20 VITALS — BP 125/88 | HR 116 | Temp 98.4°F | Ht 61.0 in | Wt 327.4 lb

## 2024-03-20 DIAGNOSIS — B372 Candidiasis of skin and nail: Secondary | ICD-10-CM | POA: Diagnosis not present

## 2024-03-20 NOTE — Progress Notes (Signed)
 "  Acute Office Visit  Patient ID: Heidi Clark, female    DOB: 15-Aug-1990, 34 y.o.   MRN: [id number]  PCP: Duanne Butler DASEN, MD  Chief Complaint  Patient presents with   Acute Visit    Blistering rash in genital area.      Subjective:     HPI  Discussed the use of AI scribe software for clinical note transcription with the patient, who gave verbal consent to proceed.  History of Present Illness Heidi Clark is a 34 year old female who presents with a painful rash in the skin folds. Was seen by my partner for this in September.  She has a persistent rash located in the skin folds, which has been worsening over time. The rash is painful, especially when walking, and does not extend to her genitals. She has been using triamcinolone  cream, AD ointment, and a white cream she believes might be zinc, but these treatments have not provided relief.  The rash initially started as a bed sore and progressively worsened. She mentions difficulty keeping the affected area dry, which exacerbates the discomfort. No fever, chills, body aches.  No fevers, chills, or body aches.   Review of Systems  All other systems reviewed and are negative.   Past Medical History:  Diagnosis Date   ADHD (attention deficit hyperactivity disorder)    ADHD (attention deficit hyperactivity disorder)    Elevated BP without diagnosis of hypertension 09/02/2019   Encounter for menstrual regulation 09/25/2015   GERD (gastroesophageal reflux disease)    Headache(784.0)    Hypertension    Insulin resistance    Obesity    Polycystic ovarian syndrome    Rash and nonspecific skin eruption 02/27/2020    Past Surgical History:  Procedure Laterality Date   Right hip     Age 63   TOOTH EXTRACTION N/A 08/12/2016   Procedure: DENTAL RESTORATION/EXTRACTIONS;  Surgeon: Sheryle Hamilton, DDS;  Location: [facility] OR;  Service: Oral Surgery;  Laterality: N/A;    Outpatient Medications Prior to Visit  Medication Sig  Dispense Refill   albuterol  (VENTOLIN  HFA) 108 (90 Base) MCG/ACT inhaler Inhale 1-2 puffs into the lungs every 6 (six) hours as needed for wheezing or shortness of breath. 18 g 1   amLODipine  (NORVASC ) 5 MG tablet TAKE 1 TABLET (5 MG TOTAL) BY MOUTH DAILY. 90 tablet 4   budesonide -formoterol  (SYMBICORT ) 160-4.5 MCG/ACT inhaler Inhale 2 puffs into the lungs 2 (two) times daily. 1 each 3   Cholecalciferol  5000 units capsule Take 1 capsule (5,000 Units total) by mouth daily.     clotrimazole -betamethasone  (LOTRISONE ) cream Apply 1 Application topically 2 (two) times daily. Use for 2 weeks only 30 g 0   ferrous sulfate  325 (65 FE) MG tablet TAKE 1 TABLET BY MOUTH TWICE A DAY 180 tablet 1   mometasone  (ELOCON ) 0.1 % cream APPLY TO AFFECTED AREA TOPICALLY EVERY DAY 15 g 1   Norethindrone -Ethinyl Estradiol -Fe Biphas (LO LOESTRIN FE ) 1 MG-10 MCG / 10 MCG tablet Take 1 tablet by mouth daily. 84 tablet 4   nystatin  (MYCOSTATIN /NYSTOP ) powder Apply 1 Application topically 2 (two) times daily. 60 g 1   nystatin -triamcinolone  ointment (MYCOLOG) Apply 1 Application topically 2 (two) times daily. 60 g 5   omeprazole  (PRILOSEC) 20 MG capsule Take 1 capsule (20 mg total) by mouth daily. 30 capsule 3   VENTOLIN  HFA 108 (90 Base) MCG/ACT inhaler INHALE 2 PUFFS INTO THE LUNGS EVERY 6 HOURS AS NEEDED FOR WHEEZE OR SHORTNESS  OF BREATH 18 each 0   vitamin B-12 (CYANOCOBALAMIN ) 1000 MCG tablet Take 1 tablet (1,000 mcg total) by mouth daily.     XIIDRA 5 % SOLN Apply 1 drop to eye 2 (two) times daily.     No facility-administered medications prior to visit.    Allergies[1]     Objective:    BP 125/88   Pulse (!) 116   Temp 98.4 F (36.9 C) (Oral)   Ht 5' 1 (1.549 m)   Wt (!) 327 lb 6.4 oz (148.5 kg)   SpO2 97%   BMI 61.86 kg/m    Physical Exam Vitals and nursing note reviewed.  Constitutional:      Appearance: Normal appearance. She is normal weight.  HENT:     Head: Normocephalic and atraumatic.   Cardiovascular:     Rate and Rhythm: Normal rate and regular rhythm.     Pulses: Normal pulses.     Heart sounds: Normal heart sounds.  Pulmonary:     Effort: Pulmonary effort is normal.     Breath sounds: Normal breath sounds.  Skin:    General: Skin is warm and dry.     Findings: Rash present.         Comments: Erythematous rash with scattered open wounds  Neurological:     General: No focal deficit present.     Mental Status: She is alert and oriented to person, place, and time. Mental status is at baseline.  Psychiatric:        Mood and Affect: Mood normal.        Behavior: Behavior normal.        Thought Content: Thought content normal.        Judgment: Judgment normal.       No results found for any visits on 03/20/24.     Assessment & Plan:   Problem List Items Addressed This Visit       Musculoskeletal and Integument   Intertriginous candidiasis - Primary   Relevant Orders   Ambulatory referral to Wound Clinic    Assessment and Plan Assessment & Plan Intertrigo Chronic intertrigo causing significant discomfort. Current treatment ineffective. - Referred to wound care specialist for further management. - Advised zinc paste, then nystatin  powder, then apply Interdry. - Keep area clean and dry - Seek medical care for fever, chills, body aches, nausea, vomiting, or worsening of rash.    No orders of the defined types were placed in this encounter.   Return if symptoms worsen or fail to improve.  Jeoffrey GORMAN Barrio, FNP [facility]      [1]  Allergies Allergen Reactions   No Known Allergies    "

## 2024-03-21 ENCOUNTER — Other Ambulatory Visit: Payer: Self-pay | Admitting: Family Medicine

## 2024-03-21 NOTE — Telephone Encounter (Signed)
 Copied from CRM [id number]. Topic: Clinical - Medication Refill >> Mar 21, 2024 12:17 PM Amy B wrote: Medication: nystatin  (MYCOSTATIN /NYSTOP ) powder  Has the patient contacted their pharmacy? No (Agent: If no, request that the patient contact the pharmacy for the refill. If patient does not wish to contact the pharmacy document the reason why and proceed with request.) (Agent: If yes, when and what did the pharmacy advise?)  This is the patient's preferred pharmacy:  [id number] [place], [place] - [address] [place] [address] Phone: [phone] Fax: [phone]  Is this the correct pharmacy for this prescription? Yes If no, delete pharmacy and type the correct one.   Has the prescription been filled recently? No  Is the patient out of the medication? Yes  Has the patient been seen for an appointment in the last year OR does the patient have an upcoming appointment? Yes  Can we respond through MyChart? Yes  Agent: Please be advised that Rx refills may take up to 3 business days. We ask that you follow-up with your pharmacy.

## 2024-03-22 NOTE — Telephone Encounter (Signed)
 Requested medication (s) are due for refill today: routing for review  Requested medication (s) are on the active medication list: yes  Last refill:  10/19/23  Future visit scheduled: no  Notes to clinic:  Unable to refill per protocol, last refill by another provider.      Requested Prescriptions  Pending Prescriptions Disp Refills   nystatin  (MYCOSTATIN /NYSTOP ) powder 60 g 1    Sig: Apply 1 Application topically 2 (two) times daily.     Off-Protocol Failed - 03/22/2024  9:07 AM      Failed - Medication not assigned to a protocol, review manually.      Failed - Valid encounter within last 12 months    Recent Outpatient Visits           2 days ago Intertriginous candidiasis   [place] [facility] Kayla Jeoffrey RAMAN, FNP   4 months ago Intertriginous candidiasis   [place] [facility] Aletha Bene, MD   1 year ago Paronychia of left index finger   [place] [facility] Kayla Jeoffrey RAMAN, FNP       Future Appointments             In 3 months Sand[place], Brenda K, PA-C [facility]

## 2024-04-23 ENCOUNTER — Ambulatory Visit: Admitting: Physician Assistant

## 2024-07-03 ENCOUNTER — Ambulatory Visit: Admitting: Physician Assistant
